# Patient Record
Sex: Male | Born: 1970 | Race: Black or African American | Hispanic: No | Marital: Married | Smoking: Former smoker
Health system: Southern US, Community
[De-identification: ages and names within clinical notes are randomized; demographics above are authoritative.]

## PROBLEM LIST (undated history)

## (undated) DIAGNOSIS — B354 Tinea corporis: Secondary | ICD-10-CM

## (undated) DIAGNOSIS — E785 Hyperlipidemia, unspecified: Secondary | ICD-10-CM

## (undated) DIAGNOSIS — A6920 Lyme disease, unspecified: Secondary | ICD-10-CM

## (undated) DIAGNOSIS — J310 Chronic rhinitis: Secondary | ICD-10-CM

## (undated) DIAGNOSIS — L309 Dermatitis, unspecified: Secondary | ICD-10-CM

## (undated) DIAGNOSIS — A6002 Herpesviral infection of other male genital organs: Secondary | ICD-10-CM

## (undated) DIAGNOSIS — F1910 Other psychoactive substance abuse, uncomplicated: Secondary | ICD-10-CM

## (undated) DIAGNOSIS — F1020 Alcohol dependence, uncomplicated: Secondary | ICD-10-CM

## (undated) DIAGNOSIS — H469 Unspecified optic neuritis: Secondary | ICD-10-CM

## (undated) HISTORY — DX: Herpesviral infection of other male genital organs: A60.02

## (undated) HISTORY — DX: Other psychoactive substance abuse, uncomplicated: F19.10

## (undated) HISTORY — PX: TONSILLECTOMY: SUR1361

## (undated) HISTORY — DX: Alcohol dependence, uncomplicated: F10.20

## (undated) HISTORY — DX: Tinea corporis: B35.4

## (undated) HISTORY — DX: Chronic rhinitis: J31.0

## (undated) HISTORY — DX: Hyperlipidemia, unspecified: E78.5

## (undated) HISTORY — DX: Dermatitis, unspecified: L30.9

## (undated) HISTORY — DX: Unspecified optic neuritis: H46.9

## (undated) HISTORY — DX: Lyme disease, unspecified: A69.20

---

## 1998-05-31 ENCOUNTER — Emergency Department (HOSPITAL_COMMUNITY): Admission: EM | Admit: 1998-05-31 | Discharge: 1998-05-31 | Payer: Self-pay | Admitting: Emergency Medicine

## 1998-12-07 ENCOUNTER — Encounter: Payer: Self-pay | Admitting: Emergency Medicine

## 1998-12-07 ENCOUNTER — Emergency Department (HOSPITAL_COMMUNITY): Admission: EM | Admit: 1998-12-07 | Discharge: 1998-12-07 | Payer: Self-pay | Admitting: Emergency Medicine

## 2002-03-08 ENCOUNTER — Encounter: Payer: Self-pay | Admitting: Internal Medicine

## 2002-03-08 ENCOUNTER — Encounter: Admission: RE | Admit: 2002-03-08 | Discharge: 2002-03-08 | Payer: Self-pay | Admitting: Internal Medicine

## 2002-11-14 ENCOUNTER — Emergency Department (HOSPITAL_COMMUNITY): Admission: EM | Admit: 2002-11-14 | Discharge: 2002-11-14 | Payer: Self-pay | Admitting: Emergency Medicine

## 2004-12-27 ENCOUNTER — Ambulatory Visit: Payer: Self-pay | Admitting: Internal Medicine

## 2004-12-28 ENCOUNTER — Ambulatory Visit: Payer: Self-pay

## 2005-01-04 ENCOUNTER — Ambulatory Visit: Payer: Self-pay

## 2005-01-14 ENCOUNTER — Ambulatory Visit: Payer: Self-pay | Admitting: *Deleted

## 2005-01-21 ENCOUNTER — Encounter: Admission: RE | Admit: 2005-01-21 | Discharge: 2005-04-21 | Payer: Self-pay | Admitting: Cardiology

## 2005-06-10 ENCOUNTER — Ambulatory Visit: Payer: Self-pay | Admitting: Internal Medicine

## 2007-02-16 ENCOUNTER — Ambulatory Visit: Payer: Self-pay | Admitting: Internal Medicine

## 2007-02-16 LAB — CONVERTED CEMR LAB
ALT: 24 units/L (ref 0–40)
AST: 22 units/L (ref 0–37)
Albumin: 3.8 g/dL (ref 3.5–5.2)
Alkaline Phosphatase: 44 units/L (ref 39–117)
BUN: 9 mg/dL (ref 6–23)
Basophils Absolute: 0 10*3/uL (ref 0.0–0.1)
Basophils Relative: 0.2 % (ref 0.0–1.0)
Bilirubin Urine: NEGATIVE
Bilirubin, Direct: 0.2 mg/dL (ref 0.0–0.3)
CO2: 28 meq/L (ref 19–32)
Calcium: 9.1 mg/dL (ref 8.4–10.5)
Chloride: 108 meq/L (ref 96–112)
Cholesterol: 178 mg/dL (ref 0–200)
Creatinine, Ser: 1 mg/dL (ref 0.4–1.5)
Eosinophils Absolute: 0.1 10*3/uL (ref 0.0–0.6)
Eosinophils Relative: 0.9 % (ref 0.0–5.0)
GFR calc Af Amer: 109 mL/min
GFR calc non Af Amer: 90 mL/min
Glucose, Bld: 103 mg/dL — ABNORMAL HIGH (ref 70–99)
HCT: 43.3 % (ref 39.0–52.0)
HDL: 40.6 mg/dL (ref >39.0)
Hemoglobin: 14.8 g/dL (ref 13.0–17.0)
Ketones, ur: NEGATIVE mg/dL
LDL Cholesterol: 122 mg/dL — ABNORMAL HIGH (ref 0–99)
Leukocytes, UA: NEGATIVE
Lymphocytes Relative: 38 % (ref 12.0–46.0)
MCHC: 34.3 g/dL (ref 30.0–36.0)
MCV: 89.6 fL (ref 78.0–100.0)
Monocytes Absolute: 0.4 10*3/uL (ref 0.2–0.7)
Monocytes Relative: 6.7 % (ref 3.0–11.0)
Neutro Abs: 3.2 10*3/uL (ref 1.4–7.7)
Neutrophils Relative %: 54.2 % (ref 43.0–77.0)
Nitrite: NEGATIVE
Platelets: 162 10*3/uL (ref 150–400)
Potassium: 4.3 meq/L (ref 3.5–5.1)
RBC: 4.83 M/uL (ref 4.22–5.81)
RDW: 13.3 % (ref 11.5–14.6)
Sodium: 142 meq/L (ref 135–145)
Specific Gravity, Urine: 1.025 (ref 1.000–1.03)
TSH: 1.59 microintl units/mL (ref 0.35–5.50)
Total Bilirubin: 0.8 mg/dL (ref 0.3–1.2)
Total CHOL/HDL Ratio: 4.4
Total Protein, Urine: NEGATIVE mg/dL
Total Protein: 6.9 g/dL (ref 6.0–8.3)
Triglycerides: 75 mg/dL (ref 0–149)
Urine Glucose: NEGATIVE mg/dL
Urobilinogen, UA: 0.2 (ref 0.0–1.0)
VLDL: 15 mg/dL (ref 0–40)
WBC: 6 10*3/uL (ref 4.5–10.5)
pH: 6 (ref 5.0–8.0)

## 2007-02-24 ENCOUNTER — Ambulatory Visit: Payer: Self-pay | Admitting: Internal Medicine

## 2007-06-22 ENCOUNTER — Ambulatory Visit: Payer: Self-pay | Admitting: Internal Medicine

## 2007-06-22 LAB — CONVERTED CEMR LAB
BUN: 12 mg/dL (ref 6–23)
CO2: 28 meq/L (ref 19–32)
Calcium: 9.1 mg/dL (ref 8.4–10.5)
Chloride: 105 meq/L (ref 96–112)
Creatinine, Ser: 1.1 mg/dL (ref 0.4–1.5)
GFR calc Af Amer: 97 mL/min
GFR calc non Af Amer: 81 mL/min
Glucose, Bld: 103 mg/dL — ABNORMAL HIGH (ref 70–99)
Potassium: 4.3 meq/L (ref 3.5–5.1)
Sodium: 139 meq/L (ref 135–145)

## 2007-06-24 ENCOUNTER — Encounter: Admission: RE | Admit: 2007-06-24 | Discharge: 2007-06-24 | Payer: Self-pay | Admitting: Internal Medicine

## 2007-07-13 ENCOUNTER — Encounter: Payer: Self-pay | Admitting: *Deleted

## 2007-08-26 DIAGNOSIS — A6920 Lyme disease, unspecified: Secondary | ICD-10-CM

## 2007-08-26 HISTORY — DX: Lyme disease, unspecified: A69.20

## 2007-08-31 ENCOUNTER — Ambulatory Visit: Payer: Self-pay | Admitting: Internal Medicine

## 2007-09-29 ENCOUNTER — Telehealth: Payer: Self-pay | Admitting: Internal Medicine

## 2007-11-11 ENCOUNTER — Ambulatory Visit: Payer: Self-pay | Admitting: Internal Medicine

## 2007-11-11 DIAGNOSIS — A6920 Lyme disease, unspecified: Secondary | ICD-10-CM | POA: Insufficient documentation

## 2007-11-11 DIAGNOSIS — M25569 Pain in unspecified knee: Secondary | ICD-10-CM | POA: Insufficient documentation

## 2008-05-16 ENCOUNTER — Encounter: Payer: Self-pay | Admitting: Internal Medicine

## 2010-01-17 ENCOUNTER — Ambulatory Visit: Payer: Self-pay | Admitting: Internal Medicine

## 2010-01-17 LAB — CONVERTED CEMR LAB
ALT: 37 units/L (ref 0–53)
AST: 34 units/L (ref 0–37)
Albumin: 4 g/dL (ref 3.5–5.2)
Alkaline Phosphatase: 61 units/L (ref 39–117)
BUN: 16 mg/dL (ref 6–23)
Basophils Absolute: 0 10*3/uL (ref 0.0–0.1)
Basophils Relative: 0.3 % (ref 0.0–3.0)
Bilirubin Urine: NEGATIVE
Bilirubin, Direct: 0.1 mg/dL (ref 0.0–0.3)
CO2: 28 meq/L (ref 19–32)
Calcium: 9.1 mg/dL (ref 8.4–10.5)
Chloride: 103 meq/L (ref 96–112)
Cholesterol: 156 mg/dL (ref 0–200)
Creatinine, Ser: 1.1 mg/dL (ref 0.4–1.5)
Eosinophils Absolute: 0 10*3/uL (ref 0.0–0.7)
Eosinophils Relative: 0.5 % (ref 0.0–5.0)
GFR calc non Af Amer: 95.88 mL/min (ref >60)
Glucose, Bld: 95 mg/dL (ref 70–99)
HCT: 44.2 % (ref 39.0–52.0)
HDL: 47.9 mg/dL (ref >39.00)
Hemoglobin, Urine: NEGATIVE
Hemoglobin: 14.7 g/dL (ref 13.0–17.0)
Ketones, ur: NEGATIVE mg/dL
LDL Cholesterol: 100 mg/dL — ABNORMAL HIGH (ref 0–99)
Leukocytes, UA: NEGATIVE
Lymphocytes Relative: 44.3 % (ref 12.0–46.0)
Lymphs Abs: 2.1 10*3/uL (ref 0.7–4.0)
MCHC: 33.2 g/dL (ref 30.0–36.0)
MCV: 90.7 fL (ref 78.0–100.0)
Monocytes Absolute: 0.4 10*3/uL (ref 0.1–1.0)
Monocytes Relative: 7.4 % (ref 3.0–12.0)
Neutro Abs: 2.3 10*3/uL (ref 1.4–7.7)
Neutrophils Relative %: 47.5 % (ref 43.0–77.0)
Nitrite: NEGATIVE
Platelets: 166 10*3/uL (ref 150.0–400.0)
Potassium: 4.4 meq/L (ref 3.5–5.1)
RBC: 4.87 M/uL (ref 4.22–5.81)
RDW: 13.5 % (ref 11.5–14.6)
Sodium: 139 meq/L (ref 135–145)
Specific Gravity, Urine: >=1.03 (ref 1.000–1.030)
TSH: 1.65 microintl units/mL (ref 0.35–5.50)
Total Bilirubin: 0.6 mg/dL (ref 0.3–1.2)
Total CHOL/HDL Ratio: 3
Total Protein, Urine: NEGATIVE mg/dL
Total Protein: 7.4 g/dL (ref 6.0–8.3)
Triglycerides: 43 mg/dL (ref 0.0–149.0)
Urine Glucose: NEGATIVE mg/dL
Urobilinogen, UA: 0.2 (ref 0.0–1.0)
VLDL: 8.6 mg/dL (ref 0.0–40.0)
WBC: 4.8 10*3/uL (ref 4.5–10.5)
pH: 5.5 (ref 5.0–8.0)

## 2010-01-22 ENCOUNTER — Ambulatory Visit: Payer: Self-pay | Admitting: Internal Medicine

## 2010-01-22 DIAGNOSIS — B354 Tinea corporis: Secondary | ICD-10-CM | POA: Insufficient documentation

## 2010-12-25 NOTE — Assessment & Plan Note (Signed)
Summary: cpx, # / cd   Vital Signs:  Patient profile:   40 year old male Height:      74 inches Weight:      209 pounds BMI:     26.93 Temp:     97.3 degrees F oral Pulse rate:   70 / minute BP sitting:   100 / 64  (left arm)  Vitals Entered By: Tora Perches (January 22, 2010 8:37 AM) CC: cpx Is Patient Diabetic? No   CC:  cpx.  History of Present Illness: The patient presents for a wellness examination  C/o gen warts off and on q 1-2 month x years - painful rash  Preventive Screening-Counseling & Management  Alcohol-Tobacco     Smoking Status: never  Current Medications (verified): 1)  Astelin 137 Mcg/spray  Soln (Azelastine Hcl) .... 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 Family History Diabetes 1st degree relative M Family History Hypertension B, F  Social History: Occupation:realtor Married Never Smoked Regular exercise-yes Remote h/o ETOH and coc  Review of Systems  The patient denies anorexia, fever, weight loss, weight gain, vision loss, decreased hearing, hoarseness, chest pain, syncope, dyspnea on exertion, peripheral edema, prolonged cough, headaches, hemoptysis, abdominal pain, melena, hematochezia, severe indigestion/heartburn, hematuria, incontinence, genital sores, muscle weakness, suspicious skin lesions, transient blindness, difficulty walking, depression, unusual weight change, abnormal bleeding, enlarged lymph nodes, angioedema, and testicular masses.         lost wt on diet  Physical Exam  General:  Well-developed,well-nourished,in no acute distress; alert,appropriate and  cooperative throughout examination Head:  Normocephalic and atraumatic without obvious abnormalities. No apparent alopecia or balding. Eyes:  No corneal or conjunctival inflammation noted. EOMI. Perrla. Funduscopic exam benign, without hemorrhages, exudates or papilledema. Vision grossly normal. Ears:  External ear exam shows no significant lesions or deformities.  Otoscopic examination reveals clear canals, tympanic membranes are intact bilaterally without bulging, retraction, inflammation or discharge. Hearing is grossly normal bilaterally. Nose:  External nasal examination shows no deformity or inflammation. Nasal mucosa are pink and moist without lesions or exudates. Mouth:  Oral mucosa and oropharynx without lesions or exudates.  Teeth in good repair. Neck:  No deformities, masses, or tenderness noted. Lungs:  Normal respiratory effort, chest expands symmetrically. Lungs are clear to auscultation, no crackles or wheezes. Heart:  Normal rate and regular rhythm. S1 and S2 normal without gallop, murmur, click, rub or other extra sounds. Abdomen:  Bowel sounds positive,abdomen soft and non-tender without masses, organomegaly or hernias noted. Genitalia:  Testes bilaterally descended without nodularity, tenderness or masses. No scrotal masses or lesions. Depidm. 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 Msk:  No deformity or scoliosis noted of thoracic or lumbar spine.   Extremities:  No clubbing, cyanosis, edema, or deformity noted with normal full range of motion of all joints.   Neurologic:  No cranial nerve deficits noted. Station and gait are normal. Plantar reflexes are down-going bilaterally. DTRs are symmetrical throughout. Sensory, motor and coordinative functions appear intact. Skin:  Intact without suspicious lesions or rashes Inguinal Nodes:  No significant adenopathy Psych:  Cognition and judgment appear intact. Alert and cooperative with normal attention  span and  concentration. No apparent delusions, illusions, hallucinations   Impression & Recommendations:  Problem # 1:  PHYSICAL EXAMINATION (ICD-V70.0) Assessment New Health and age related issues were discussed. Available screening tests and vaccinations were discussed as well. Healthy life style including good diet and execise was discussed.  The labs were reviewed with the patient.  Orders: EKG w/ Interpretation (93000)  Problem # 2:  Penile rash poss gen herpes, no warts Assessment: Unchanged Will treat w/Acyclovir Condoms  Problem # 3:  TINEA CORPORIS (ICD-110.5) Assessment: New See "Patient Instructions".   Problem # 4:  Elev glu Assessment: Improved  Complete Medication List: 1)  Astelin 137 Mcg/spray Soln (Azelastine hcl) .... Two times a day 2)  Acyclovir 400 Mg Tabs (Acyclovir) .Marland Kitchen.. 1 by mouth three times a day as needed herpes x 7 d 3)  Ketoconazole 2 % Crea (Ketoconazole) .... Use bid 4)  Vitamin D 1000 Unit Tabs (Cholecalciferol) .Marland Kitchen.. 1 by mouth qd  Other Orders: Admin 1st Vaccine (78295) Flu Vaccine 81yrs + (62130)  Patient Instructions: 1)  Try to eat more raw plant food, fresh and dry fruit, raw almonds, leafy vegetables, whole foods and less red meat, less animal fat. Poultry and fish is better for you than pork and beef. Avoid processed foods (canned soups, hot dogs, sausage, bacon , frozen dinners). Avoid corn syrup, high fructose syrup or aspartam . Make your own  dressing with olive oil, wine vinegar, lemon juce, garlic etc. for your salads. 2)  Please schedule a follow-up appointment in 1 year. Prescriptions: KETOCONAZOLE 2 % CREA (KETOCONAZOLE) use bid  #90 g x 3   Entered and Authorized by:   Tresa Garter MD   Signed by:   Tresa Garter MD on 01/22/2010   Method used:   Print then Give to Patient   RxID:   [id number] ACYCLOVIR 400 MG TABS (ACYCLOVIR) 1 by mouth three times a day as needed herpes x 7 d  #21 x 3   Entered and Authorized by:    Tresa Garter MD   Signed by:   Tresa Garter MD on 01/22/2010   Method used:   Print then Give to Patient   RxID:   [id number]    Influenza Vaccine (to be given today)     Flu Vaccine Consent Questions     Do you have a history of severe allergic reactions to this vaccine? no    Any prior history of allergic reactions to egg and/or gelatin? no    Do you have a sensitivity to the preservative Thimersol? no    Do you have a past history of Guillan-Barre Syndrome? no    Do you currently have an acute febrile illness? no    Have you ever had a severe reaction to latex? no    Vaccine information given and explained to patient? yes    Are you currently pregnant? no    Lot Number:[id number]   Exp Date:05/24/2010   Site Given  right Deltoid IMbflu

## 2011-09-19 ENCOUNTER — Telehealth: Payer: Self-pay | Admitting: *Deleted

## 2011-09-19 NOTE — Telephone Encounter (Signed)
Pt left vm asking when/where he was referred out for his swollen optic nerve. He states he is applying for life ins and they are needing this info. Called pt to advise we need to pull his paper chart because I cant find any referrals in [facility]. Call was disconnected. Will call patient after I review paper chart.

## 2011-09-20 NOTE — Telephone Encounter (Signed)
I reviewed pt's paper chart. All I can find is a referral we arranged for pt to see Dr. Vickey Huger in 06/2007 and correspondence from when pt was evaluated by Fabio Neighbors, O.D. On 02/06/2007. I left him a detailed mess informing pt of this.

## 2012-04-29 ENCOUNTER — Encounter: Payer: Self-pay | Admitting: Internal Medicine

## 2012-04-29 ENCOUNTER — Ambulatory Visit (INDEPENDENT_AMBULATORY_CARE_PROVIDER_SITE_OTHER): Payer: BC Managed Care – PPO | Admitting: Internal Medicine

## 2012-04-29 VITALS — BP 110/60 | HR 72 | Temp 97.5°F | Resp 16 | Wt 224.0 lb

## 2012-04-29 DIAGNOSIS — Z800 Family history of malignant neoplasm of digestive organs: Secondary | ICD-10-CM | POA: Insufficient documentation

## 2012-04-29 DIAGNOSIS — Z0000 Encounter for general adult medical examination without abnormal findings: Secondary | ICD-10-CM

## 2012-04-29 DIAGNOSIS — Z23 Encounter for immunization: Secondary | ICD-10-CM

## 2012-04-29 DIAGNOSIS — L0201 Cutaneous abscess of face: Secondary | ICD-10-CM

## 2012-04-29 MED ORDER — DOXYCYCLINE HYCLATE 100 MG PO TABS: 100.0000 mg | ORAL_TABLET | Freq: Two times a day (BID) | ORAL | Status: AC

## 2012-04-29 NOTE — Assessment & Plan Note (Signed)
Doxy x 10 d 

## 2012-04-29 NOTE — Assessment & Plan Note (Signed)
GI cons re -- does he need a colon?

## 2012-04-29 NOTE — Progress Notes (Signed)
  Subjective:    Patient ID: Timothy Petersen, male    DOB: [birth year], 40 y.o.   MRN: [id number]  HPI C/o a boil on  L face x 2 wks and gland swelling The patient is here for a wellness exam. The patient has been doing well overall without major physical or psychological issues going on lately.  Review of Systems  Constitutional: Negative for appetite change, fatigue and unexpected weight change.  HENT: Negative for nosebleeds, congestion, sore throat, sneezing, trouble swallowing and neck pain.   Eyes: Negative for itching and visual disturbance.  Respiratory: Negative for cough.   Cardiovascular: Negative for chest pain, palpitations and leg swelling.  Gastrointestinal: Negative for nausea, vomiting, abdominal pain, diarrhea, constipation, blood in stool and abdominal distention.  Genitourinary: Negative for frequency and hematuria.  Musculoskeletal: Negative for back pain, joint swelling and gait problem.  Skin: Negative for rash and wound.  Neurological: Negative for dizziness, tremors, speech difficulty and weakness.  Psychiatric/Behavioral: Negative for suicidal ideas, sleep disturbance, dysphoric mood and agitation. The patient is not nervous/anxious.        Objective:   Physical Exam  Constitutional: He is oriented to person, place, and time. He appears well-developed.  HENT:  Mouth/Throat: Oropharynx is clear and moist.  Eyes: Conjunctivae are normal. Pupils are equal, round, and reactive to light.  Neck: Normal range of motion. No JVD present. No thyromegaly present.  Cardiovascular: Normal rate, regular rhythm, normal heart sounds and intact distal pulses.  Exam reveals no gallop and no friction rub.   No murmur heard. Pulmonary/Chest: Effort normal and breath sounds normal. No respiratory distress. He has no wheezes. He has no rales. He exhibits no tenderness.  Abdominal: Soft. Bowel sounds are normal. He exhibits no distension and no mass. There is no tenderness. There is  no rebound and no guarding.  Musculoskeletal: Normal range of motion. He exhibits no edema and no tenderness.  Lymphadenopathy:    He has no cervical adenopathy.  Neurological: He is alert and oriented to person, place, and time. He has normal reflexes. No cranial nerve deficit. He exhibits normal muscle tone. Coordination normal.  Skin: Skin is warm and dry. No rash noted.       Boil<1 cm L cheek  Psychiatric: He has a normal mood and affect. His behavior is normal. Judgment and thought content normal.  Prostate/rectal - per GI        Assessment & Plan:

## 2012-05-19 ENCOUNTER — Encounter: Payer: Self-pay | Admitting: Internal Medicine

## 2012-05-22 ENCOUNTER — Other Ambulatory Visit (INDEPENDENT_AMBULATORY_CARE_PROVIDER_SITE_OTHER): Payer: BC Managed Care – PPO

## 2012-05-22 DIAGNOSIS — Z800 Family history of malignant neoplasm of digestive organs: Secondary | ICD-10-CM

## 2012-05-22 DIAGNOSIS — L0201 Cutaneous abscess of face: Secondary | ICD-10-CM

## 2012-05-22 DIAGNOSIS — Z0000 Encounter for general adult medical examination without abnormal findings: Secondary | ICD-10-CM

## 2012-05-22 DIAGNOSIS — L03211 Cellulitis of face: Secondary | ICD-10-CM

## 2012-05-22 LAB — CBC WITH DIFFERENTIAL/PLATELET
Basophils Relative: 0.6 % (ref 0.0–3.0)
Eosinophils Absolute: 0.1 10*3/uL (ref 0.0–0.7)
Eosinophils Relative: 1 % (ref 0.0–5.0)
Lymphocytes Relative: 44.9 % (ref 12.0–46.0)
MCV: 90.5 fl (ref 78.0–100.0)
Monocytes Absolute: 0.5 10*3/uL (ref 0.1–1.0)
Neutrophils Relative %: 45 % (ref 43.0–77.0)
Platelets: 154 10*3/uL (ref 150.0–400.0)
RBC: 4.85 Mil/uL (ref 4.22–5.81)
WBC: 5.6 10*3/uL (ref 4.5–10.5)

## 2012-05-22 LAB — BASIC METABOLIC PANEL
BUN: 17 mg/dL (ref 6–23)
Calcium: 9.7 mg/dL (ref 8.4–10.5)
Creatinine, Ser: 1.2 mg/dL (ref 0.4–1.5)
GFR: 84.08 mL/min (ref >60.00)

## 2012-05-22 LAB — PSA: PSA: 0.29 ng/mL (ref 0.10–4.00)

## 2012-05-22 LAB — URINALYSIS
Bilirubin Urine: NEGATIVE
Nitrite: NEGATIVE
Total Protein, Urine: NEGATIVE
pH: 6.5 (ref 5.0–8.0)

## 2012-05-22 LAB — LIPID PANEL
Cholesterol: 193 mg/dL (ref 0–200)
HDL: 50.7 mg/dL (ref >39.00)
LDL Cholesterol: 134 mg/dL — ABNORMAL HIGH (ref 0–99)
Total CHOL/HDL Ratio: 4
Triglycerides: 44 mg/dL (ref 0.0–149.0)

## 2012-05-22 LAB — HEPATIC FUNCTION PANEL
AST: 75 U/L — ABNORMAL HIGH (ref 0–37)
Albumin: 4.1 g/dL (ref 3.5–5.2)
Alkaline Phosphatase: 45 U/L (ref 39–117)
Total Protein: 7.2 g/dL (ref 6.0–8.3)

## 2012-05-22 LAB — TSH: TSH: 2.52 u[IU]/mL (ref 0.35–5.50)

## 2012-06-04 ENCOUNTER — Encounter: Payer: Self-pay | Admitting: Internal Medicine

## 2012-06-04 ENCOUNTER — Ambulatory Visit (INDEPENDENT_AMBULATORY_CARE_PROVIDER_SITE_OTHER): Payer: BC Managed Care – PPO | Admitting: Internal Medicine

## 2012-06-04 ENCOUNTER — Telehealth: Payer: Self-pay

## 2012-06-04 VITALS — BP 100/78 | HR 60 | Ht 74.0 in | Wt 224.2 lb

## 2012-06-04 DIAGNOSIS — Z800 Family history of malignant neoplasm of digestive organs: Secondary | ICD-10-CM

## 2012-06-04 DIAGNOSIS — Z1211 Encounter for screening for malignant neoplasm of colon: Secondary | ICD-10-CM

## 2012-06-04 MED ORDER — MOVIPREP 100 G PO SOLR: ORAL | Status: DC

## 2012-06-04 NOTE — Patient Instructions (Addendum)
You have been scheduled for a colonoscopy with propofol. Please follow written instructions given to you at your visit today.  Please pick up your prep kit at the pharmacy within the next 1-3 days. If you use inhalers (even only as needed), please bring them with you on the day of your procedure.   Thank you for choosing [facility] today.

## 2012-06-04 NOTE — Telephone Encounter (Signed)
Pt called requesting MD's advisement regarding elevation of AST enzymes seen on most recent labs, please advise.

## 2012-06-04 NOTE — Telephone Encounter (Signed)
Not sure - it was mild. Recheck LFT, HepBsAb, HepBsAg, Hep C serol -- fasting in 2 wks Thx

## 2012-06-04 NOTE — Progress Notes (Signed)
  Subjective:    Patient ID: Timothy Petersen, male    DOB: [birth year], 41 y.o.   MRN: [id number]  HPI This is a middle-here to discuss colonoscopy. His sister was diagnosed with rectal cancer at the age of 36 [place] one year ago. She is status post resection and radiation and chemotherapy and is doing well currently. He has no GI symptoms. There is no other family history of GI cancer or genitourinary cancers.aged [place] man  Medications, allergies, past medical history, past surgical history, family history and social history are reviewed and updated in the EMR.  Review of Systems  positive for palpitations, allergies, recent facial abscess treated successfully. Outpatient oral antibiotics.     Objective:   Physical Exam General:  NAD Eyes:   anicteric Lungs:  clear Heart:  S1S2 no rubs, murmurs or gallops Abdomen:  soft and nontender, BS+ Ext:   no edema      Assessment & Plan:   1. Family history of colon cancer  in sister, age 63 (rectal cancer)   2. Special screening for malignancy of colon and rectum   He is at  increased risk of colorectal cancer given his family history with one first-degree relative diagnosed before the age of 41. Colonoscopy will be undertaken.The risks and benefits as well as alternatives of endoscopic procedure(s) have been discussed and reviewed. All questions answered. The patient agrees to proceed.

## 2012-06-05 NOTE — Telephone Encounter (Signed)
Left mess for patient to call back.  

## 2012-07-02 ENCOUNTER — Ambulatory Visit (AMBULATORY_SURGERY_CENTER): Payer: BC Managed Care – PPO | Admitting: Internal Medicine

## 2012-07-02 ENCOUNTER — Encounter: Payer: Self-pay | Admitting: Internal Medicine

## 2012-07-02 VITALS — BP 144/92 | HR 91 | Temp 97.9°F | Resp 16 | Ht 74.0 in | Wt 224.0 lb

## 2012-07-02 DIAGNOSIS — Z800 Family history of malignant neoplasm of digestive organs: Secondary | ICD-10-CM

## 2012-07-02 DIAGNOSIS — Z1211 Encounter for screening for malignant neoplasm of colon: Secondary | ICD-10-CM

## 2012-07-02 MED ORDER — SODIUM CHLORIDE 0.9 % IV SOLN: 500.0000 mL | INTRAVENOUS | Status: DC

## 2012-07-02 NOTE — Op Note (Signed)
[facility] [address] [place], [place]  [address]  COLONOSCOPY PROCEDURE REPORT  PATIENT:  Timothy, Petersen  MR#:  [id number] BIRTHDATE:  Apr 23, 1971, 41 yrs. old  GENDER:  male ENDOSCOPIST:  Iva Boop, MD, [facility] REF. BY:  Linda Hedges. Plotnikov, M.D. PROCEDURE DATE:  07/02/2012 PROCEDURE:  Colonoscopy 62130 ASA CLASS:  Class II INDICATIONS:  Elevated Risk Screening, family history of colon cancer sister w/ rectal cancer at 6 MEDICATIONS:   These medications were titrated to patient response per physician's verbal order, MAC sedation, administered by CRNA, propofol (Diprivan) 450 mg IV  DESCRIPTION OF PROCEDURE:   After the risks benefits and alternatives of the procedure were thoroughly explained, informed consent was obtained.  Digital rectal exam was performed and revealed no abnormalities and normal prostate.   The LB [id number] endoscope was introduced through the anus and advanced to the cecum, which was identified by both the appendix and ileocecal valve, without limitations.  The quality of the prep was excellent, using MoviPrep.  The instrument was then slowly withdrawn as the colon was fully examined. <<PROCEDUREIMAGES>>  FINDINGS:  A normal appearing cecum, ileocecal valve, and appendiceal orifice were identified. The ascending, hepatic flexure, transverse, splenic flexure, descending, sigmoid colon, and rectum appeared unremarkable.   Retroflexed views in the right colon and rectum revealed no abnormalities.    The time to cecum = 4:36 minutes. The scope was then withdrawn in 9:05 minutes from the cecum and the procedure completed. COMPLICATIONS:  None ENDOSCOPIC IMPRESSION: 1) Normal colonoscopy w/ excellent prep 2) Family history of rectal cancer  REPEAT EXAM:  In 5 year(s) for routine screening colonoscopy.  Iva Boop, MD, [facility]  CC:  Linda Hedges. Plotnikov, MD and The Patient  n. eSIGNED:   Iva Boop at 07/02/2012 02:34  PM  Marcina Millard, [phone]

## 2012-07-02 NOTE — Progress Notes (Signed)
Patient did not experience any of the following events: a burn prior to discharge; a fall within the facility; wrong site/side/patient/procedure/implant event; or a hospital transfer or hospital admission upon discharge from the facility. (G8907) Patient did not have preoperative order for IV antibiotic SSI prophylaxis. (G8918)  

## 2012-07-02 NOTE — Patient Instructions (Addendum)
The colonoscopy was normal and you had an excellent prep.  You should repeat a colonoscopy in 2018 (5 years).  Thank you for choosing me and [facility].  Iva Boop, MD, FACG  YOU HAD AN ENDOSCOPIC PROCEDURE TODAY AT THE [facility]: Refer to the procedure report that was given to you for any specific questions about what was found during the examination.  If the procedure report does not answer your questions, please call your gastroenterologist to clarify.  If you requested that your care partner not be given the details of your procedure findings, then the procedure report has been included in a sealed envelope for you to review at your convenience later.  YOU SHOULD EXPECT: Some feelings of bloating in the abdomen. Passage of more gas than usual.  Walking can help get rid of the air that was put into your GI tract during the procedure and reduce the bloating. If you had a lower endoscopy (such as a colonoscopy or flexible sigmoidoscopy) you may notice spotting of blood in your stool or on the toilet paper. If you underwent a bowel prep for your procedure, then you may not have a normal bowel movement for a few days.  DIET: Your first meal following the procedure should be a light meal and then it is ok to progress to your normal diet.  A half-sandwich or bowl of soup is an example of a good first meal.  Heavy or fried foods are harder to digest and may make you feel nauseous or bloated.  Likewise meals heavy in dairy and vegetables can cause extra gas to form and this can also increase the bloating.  Drink plenty of fluids but you should avoid alcoholic beverages for 24 hours.  ACTIVITY: Your care partner should take you home directly after the procedure.  You should plan to take it easy, moving slowly for the rest of the day.  You can resume normal activity the day after the procedure however you should NOT DRIVE or use heavy machinery for 24 hours (because of the  sedation medicines used during the test).    SYMPTOMS TO REPORT IMMEDIATELY: A gastroenterologist can be reached at any hour.  During normal business hours, 8:30 AM to 5:00 PM Monday through Friday, call [phone].  After hours and on weekends, please call the GI answering service at [phone] who will take a message and have the physician on call contact you.   Following lower endoscopy (colonoscopy or flexible sigmoidoscopy):  Excessive amounts of blood in the stool  Significant tenderness or worsening of abdominal pains  Swelling of the abdomen that is new, acute  Fever of 100F or higher  Following upper endoscopy (EGD)  Vomiting of blood or coffee ground material  New chest pain or pain under the shoulder blades  Painful or persistently difficult swallowing  New shortness of breath  Fever of 100F or higher  Black, tarry-looking stools  FOLLOW UP: If any biopsies were taken you will be contacted by phone or by letter within the next 1-3 weeks.  Call your gastroenterologist if you have not heard about the biopsies in 3 weeks.  Our staff will call the home number listed on your records the next business day following your procedure to check on you and address any questions or concerns that you may have at that time regarding the information given to you following your procedure. This is a courtesy call and so if there is no answer at  the home number and we have not heard from you through the emergency physician on call, we will assume that you have returned to your regular daily activities without incident.  SIGNATURES/CONFIDENTIALITY: You and/or your care partner have signed paperwork which will be entered into your electronic medical record.  These signatures attest to the fact that that the information above on your After Visit Summary has been reviewed and is understood.  Full responsibility of the confidentiality of this discharge information lies with you and/or your  care-partner.

## 2012-07-03 ENCOUNTER — Telehealth: Payer: Self-pay | Admitting: *Deleted

## 2012-07-03 NOTE — Telephone Encounter (Signed)
  Follow up Call-  Call back number 07/02/2012  Post procedure Call Back phone  # [phone]  Permission to leave phone message Yes     [facility]

## 2013-04-28 ENCOUNTER — Other Ambulatory Visit: Payer: Self-pay | Admitting: *Deleted

## 2013-04-28 ENCOUNTER — Other Ambulatory Visit (INDEPENDENT_AMBULATORY_CARE_PROVIDER_SITE_OTHER): Payer: BC Managed Care – PPO

## 2013-04-28 DIAGNOSIS — Z0000 Encounter for general adult medical examination without abnormal findings: Secondary | ICD-10-CM

## 2013-04-28 DIAGNOSIS — Z125 Encounter for screening for malignant neoplasm of prostate: Secondary | ICD-10-CM

## 2013-04-28 LAB — CBC WITH DIFFERENTIAL/PLATELET
Basophils Relative: 0.6 % (ref 0.0–3.0)
Eosinophils Relative: 5.6 % — ABNORMAL HIGH (ref 0.0–5.0)
HCT: 45.9 % (ref 39.0–52.0)
Lymphs Abs: 2.2 10*3/uL (ref 0.7–4.0)
MCV: 90.3 fl (ref 78.0–100.0)
Monocytes Relative: 6 % (ref 3.0–12.0)
Neutrophils Relative %: 52.4 % (ref 43.0–77.0)
Platelets: 153 10*3/uL (ref 150.0–400.0)
RBC: 5.08 Mil/uL (ref 4.22–5.81)
WBC: 6.3 10*3/uL (ref 4.5–10.5)

## 2013-04-28 LAB — TSH: TSH: 2.04 u[IU]/mL (ref 0.35–5.50)

## 2013-04-28 LAB — BASIC METABOLIC PANEL
BUN: 15 mg/dL (ref 6–23)
Creatinine, Ser: 1 mg/dL (ref 0.4–1.5)
GFR: 101.75 mL/min (ref >60.00)

## 2013-04-28 LAB — URINALYSIS, ROUTINE W REFLEX MICROSCOPIC
Hgb urine dipstick: NEGATIVE
Nitrite: NEGATIVE
RBC / HPF: NONE SEEN (ref 0–?)
Specific Gravity, Urine: >=1.03 (ref 1.000–1.030)
Total Protein, Urine: NEGATIVE
WBC, UA: NONE SEEN (ref 0–?)
pH: 6 (ref 5.0–8.0)

## 2013-04-28 LAB — LIPID PANEL
Cholesterol: 177 mg/dL (ref 0–200)
LDL Cholesterol: 120 mg/dL — ABNORMAL HIGH (ref 0–99)
Total CHOL/HDL Ratio: 4
Triglycerides: 54 mg/dL (ref 0.0–149.0)
VLDL: 10.8 mg/dL (ref 0.0–40.0)

## 2013-04-28 LAB — HEPATIC FUNCTION PANEL
Bilirubin, Direct: 0.1 mg/dL (ref 0.0–0.3)
Total Bilirubin: 0.8 mg/dL (ref 0.3–1.2)

## 2013-04-28 LAB — PSA: PSA: 0.35 ng/mL (ref 0.10–4.00)

## 2013-04-30 ENCOUNTER — Ambulatory Visit (INDEPENDENT_AMBULATORY_CARE_PROVIDER_SITE_OTHER): Payer: BC Managed Care – PPO | Admitting: Internal Medicine

## 2013-04-30 ENCOUNTER — Other Ambulatory Visit (INDEPENDENT_AMBULATORY_CARE_PROVIDER_SITE_OTHER): Payer: BC Managed Care – PPO

## 2013-04-30 ENCOUNTER — Encounter: Payer: Self-pay | Admitting: Internal Medicine

## 2013-04-30 VITALS — BP 114/76 | HR 72 | Temp 98.3°F | Resp 16 | Wt 221.0 lb

## 2013-04-30 DIAGNOSIS — R1013 Epigastric pain: Secondary | ICD-10-CM | POA: Insufficient documentation

## 2013-04-30 LAB — H. PYLORI ANTIBODY, IGG: H Pylori IgG: NEGATIVE

## 2013-04-30 MED ORDER — PANTOPRAZOLE SODIUM 40 MG PO TBEC: 40.0000 mg | DELAYED_RELEASE_TABLET | Freq: Every day | ORAL | Status: DC

## 2013-04-30 NOTE — Assessment & Plan Note (Addendum)
Omeprazole samples 20 mg/d Labs H pilori Do not use MVI/supplements

## 2013-04-30 NOTE — Progress Notes (Signed)
  Subjective:    Patient ID: Timothy Petersen, male    DOB: [birth year], 42 y.o.   MRN: [id number]  Abdominal Pain This is a new problem. The current episode started in the past 7 days. The onset quality is gradual. The problem occurs 2 to 4 times per day. The problem has been waxing and waning. The pain is located in the epigastric region. The pain is at a severity of 4/10. The pain is moderate. The quality of the pain is sharp. The abdominal pain radiates to the epigastric region. Associated symptoms include nausea. He has tried antacids for the symptoms. The treatment provided mild relief. His past medical history is significant for PUD.      Review of Systems  Gastrointestinal: Positive for nausea and abdominal pain.       Objective:   Physical Exam  Constitutional: He is oriented to person, place, and time. He appears well-developed. No distress.  NAD  HENT:  Mouth/Throat: Oropharynx is clear and moist.  Eyes: Conjunctivae are normal. Pupils are equal, round, and reactive to light.  Neck: Normal range of motion. No JVD present. No thyromegaly present.  Cardiovascular: Normal rate, regular rhythm, normal heart sounds and intact distal pulses.  Exam reveals no gallop and no friction rub.   No murmur heard. Pulmonary/Chest: Effort normal and breath sounds normal. No respiratory distress. He has no wheezes. He has no rales. He exhibits no tenderness.  Abdominal: Soft. Bowel sounds are normal. He exhibits no distension and no mass. There is tenderness (sensitive in epigastric area). There is no rebound and no guarding.  Musculoskeletal: Normal range of motion. He exhibits no edema and no tenderness.  Lymphadenopathy:    He has no cervical adenopathy.  Neurological: He is alert and oriented to person, place, and time. He has normal reflexes. No cranial nerve deficit. He exhibits normal muscle tone. He displays a negative Romberg sign. Coordination and gait normal.  No meningeal signs   Skin: Skin is warm and dry. No rash noted.  Psychiatric: He has a normal mood and affect. His behavior is normal. Judgment and thought content normal.          Assessment & Plan:

## 2013-05-04 ENCOUNTER — Ambulatory Visit (INDEPENDENT_AMBULATORY_CARE_PROVIDER_SITE_OTHER): Payer: BC Managed Care – PPO | Admitting: Internal Medicine

## 2013-05-04 ENCOUNTER — Encounter: Payer: Self-pay | Admitting: Internal Medicine

## 2013-05-04 VITALS — BP 108/62 | HR 68 | Temp 98.1°F | Resp 16 | Ht 74.0 in | Wt 221.0 lb

## 2013-05-04 DIAGNOSIS — Z0000 Encounter for general adult medical examination without abnormal findings: Secondary | ICD-10-CM

## 2013-05-04 DIAGNOSIS — R1013 Epigastric pain: Secondary | ICD-10-CM

## 2013-05-04 NOTE — Progress Notes (Deleted)
Patient ID: Timothy Petersen, male   DOB: 08-Nov-1971, 42 y.o.   MRN: [id number]

## 2013-05-08 DIAGNOSIS — Z0000 Encounter for general adult medical examination without abnormal findings: Secondary | ICD-10-CM | POA: Insufficient documentation

## 2013-05-08 NOTE — Assessment & Plan Note (Signed)
6/14 gastritis - better Finish PPI after 6 weeks

## 2013-05-08 NOTE — Progress Notes (Signed)
  Subjective:     HPI  The patient is here for a wellness exam. The epig pain is better  BP Readings from Last 3 Encounters:  05/04/13 108/62  04/30/13 114/76  07/02/12 144/92   Wt Readings from Last 3 Encounters:  05/04/13 221 lb (100.245 kg)  04/30/13 221 lb (100.245 kg)  07/02/12 224 lb (101.606 kg)      .   Review of Systems  Constitutional: Negative for appetite change, fatigue and unexpected weight change.  HENT: Negative for nosebleeds, congestion, sore throat, sneezing, trouble swallowing and neck pain.   Eyes: Negative for itching and visual disturbance.  Respiratory: Negative for cough.   Cardiovascular: Negative for chest pain, palpitations and leg swelling.  Gastrointestinal: Negative for nausea, diarrhea, blood in stool and abdominal distention.  Genitourinary: Negative for frequency and hematuria.  Musculoskeletal: Negative for back pain, joint swelling and gait problem.  Skin: Negative for rash.  Neurological: Negative for dizziness, tremors, speech difficulty and weakness.  Psychiatric/Behavioral: Negative for sleep disturbance, dysphoric mood and agitation. The patient is not nervous/anxious.        Objective:   Physical Exam  Constitutional: He is oriented to person, place, and time. He appears well-developed. No distress.  NAD  HENT:  Mouth/Throat: Oropharynx is clear and moist.  Eyes: Conjunctivae are normal. Pupils are equal, round, and reactive to light.  Neck: Normal range of motion. No JVD present. No thyromegaly present.  Cardiovascular: Normal rate, regular rhythm, normal heart sounds and intact distal pulses.  Exam reveals no gallop and no friction rub.   No murmur heard. Pulmonary/Chest: Effort normal and breath sounds normal. No respiratory distress. He has no wheezes. He has no rales. He exhibits no tenderness.  Abdominal: Soft. Bowel sounds are normal. He exhibits no distension and no mass. There is no tenderness. There is no rebound and  no guarding.  Musculoskeletal: Normal range of motion. He exhibits no edema and no tenderness.  Lymphadenopathy:    He has no cervical adenopathy.  Neurological: He is alert and oriented to person, place, and time. He has normal reflexes. No cranial nerve deficit. He exhibits normal muscle tone. He displays a negative Romberg sign. Coordination and gait normal.  No meningeal signs  Skin: Skin is warm and dry. No rash noted.  Psychiatric: He has a normal mood and affect. His behavior is normal. Judgment and thought content normal.    Lab Results  Component Value Date   WBC 6.3 04/28/2013   HGB 15.4 04/28/2013   HCT 45.9 04/28/2013   PLT 153.0 04/28/2013   GLUCOSE 91 04/28/2013   CHOL 177 04/28/2013   TRIG 54.0 04/28/2013   HDL 45.80 04/28/2013   LDLCALC 120* 04/28/2013   ALT 26 04/28/2013   AST 23 04/28/2013   NA 139 04/28/2013   K 4.5 04/28/2013   CL 106 04/28/2013   CREATININE 1.0 04/28/2013   BUN 15 04/28/2013   CO2 25 04/28/2013   TSH 2.04 04/28/2013   PSA 0.35 04/28/2013         Assessment & Plan:

## 2013-05-08 NOTE — Assessment & Plan Note (Signed)
We discussed age appropriate health related issues, including available/recomended screening tests and vaccinations. We discussed a need for adhering to healthy diet and exercise. Labs/EKG were reviewed/ordered. All questions were answered.   

## 2013-09-30 ENCOUNTER — Other Ambulatory Visit: Payer: Self-pay

## 2014-05-19 ENCOUNTER — Encounter: Payer: Self-pay | Admitting: Internal Medicine

## 2014-05-19 ENCOUNTER — Ambulatory Visit (INDEPENDENT_AMBULATORY_CARE_PROVIDER_SITE_OTHER): Payer: No Typology Code available for payment source | Admitting: Internal Medicine

## 2014-05-19 ENCOUNTER — Other Ambulatory Visit (INDEPENDENT_AMBULATORY_CARE_PROVIDER_SITE_OTHER): Payer: No Typology Code available for payment source

## 2014-05-19 VITALS — BP 120/80 | HR 76 | Temp 98.4°F | Resp 16 | Ht 74.0 in | Wt 209.0 lb

## 2014-05-19 DIAGNOSIS — G43909 Migraine, unspecified, not intractable, without status migrainosus: Secondary | ICD-10-CM | POA: Insufficient documentation

## 2014-05-19 DIAGNOSIS — Z0000 Encounter for general adult medical examination without abnormal findings: Secondary | ICD-10-CM

## 2014-05-19 DIAGNOSIS — R51 Headache: Secondary | ICD-10-CM

## 2014-05-19 LAB — CBC WITH DIFFERENTIAL/PLATELET
BASOS PCT: 0.4 % (ref 0.0–3.0)
Basophils Absolute: 0 10*3/uL (ref 0.0–0.1)
EOS PCT: 0.5 % (ref 0.0–5.0)
Eosinophils Absolute: 0 10*3/uL (ref 0.0–0.7)
HCT: 46.1 % (ref 39.0–52.0)
Hemoglobin: 15.7 g/dL (ref 13.0–17.0)
LYMPHS ABS: 2 10*3/uL (ref 0.7–4.0)
Lymphocytes Relative: 27.8 % (ref 12.0–46.0)
MCHC: 34 g/dL (ref 30.0–36.0)
MCV: 91.5 fl (ref 78.0–100.0)
MONOS PCT: 6.9 % (ref 3.0–12.0)
Monocytes Absolute: 0.5 10*3/uL (ref 0.1–1.0)
Neutro Abs: 4.7 10*3/uL (ref 1.4–7.7)
Neutrophils Relative %: 64.4 % (ref 43.0–77.0)
PLATELETS: 185 10*3/uL (ref 150.0–400.0)
RBC: 5.04 Mil/uL (ref 4.22–5.81)
RDW: 14.7 % (ref 11.5–15.5)
WBC: 7.3 10*3/uL (ref 4.0–10.5)

## 2014-05-19 LAB — BASIC METABOLIC PANEL
BUN: 11 mg/dL (ref 6–23)
CALCIUM: 9.8 mg/dL (ref 8.4–10.5)
CO2: 32 meq/L (ref 19–32)
Chloride: 103 mEq/L (ref 96–112)
Creatinine, Ser: 1.1 mg/dL (ref 0.4–1.5)
GFR: 95.85 mL/min (ref >60.00)
GLUCOSE: 98 mg/dL (ref 70–99)
Potassium: 4.3 mEq/L (ref 3.5–5.1)
SODIUM: 136 meq/L (ref 135–145)

## 2014-05-19 LAB — HEPATIC FUNCTION PANEL
ALBUMIN: 4.5 g/dL (ref 3.5–5.2)
ALT: 24 U/L (ref 0–53)
AST: 31 U/L (ref 0–37)
Alkaline Phosphatase: 50 U/L (ref 39–117)
Bilirubin, Direct: 0.2 mg/dL (ref 0.0–0.3)
TOTAL PROTEIN: 7.3 g/dL (ref 6.0–8.3)
Total Bilirubin: 1.1 mg/dL (ref 0.2–1.2)

## 2014-05-19 LAB — LIPID PANEL
Cholesterol: 210 mg/dL — ABNORMAL HIGH (ref 0–200)
HDL: 66.8 mg/dL (ref >39.00)
LDL CALC: 135 mg/dL — AB (ref 0–99)
NonHDL: 143.2
TRIGLYCERIDES: 40 mg/dL (ref 0.0–149.0)
Total CHOL/HDL Ratio: 3
VLDL: 8 mg/dL (ref 0.0–40.0)

## 2014-05-19 LAB — URINALYSIS
Bilirubin Urine: NEGATIVE
Hgb urine dipstick: NEGATIVE
Ketones, ur: NEGATIVE
Leukocytes, UA: NEGATIVE
Nitrite: NEGATIVE
PH: 6.5 (ref 5.0–8.0)
SPECIFIC GRAVITY, URINE: 1.02 (ref 1.000–1.030)
Total Protein, Urine: NEGATIVE
URINE GLUCOSE: NEGATIVE
Urobilinogen, UA: 1 (ref 0.0–1.0)

## 2014-05-19 LAB — TSH: TSH: 1.78 u[IU]/mL (ref 0.35–4.50)

## 2014-05-19 LAB — HIV ANTIBODY (ROUTINE TESTING W REFLEX): HIV 1&2 Ab, 4th Generation: NONREACTIVE

## 2014-05-19 LAB — PSA: PSA: 0.54 ng/mL (ref 0.10–4.00)

## 2014-05-19 LAB — TESTOSTERONE: Testosterone: 230.87 ng/dL — ABNORMAL LOW (ref 300.00–890.00)

## 2014-05-19 NOTE — Assessment & Plan Note (Signed)
We discussed age appropriate health related issues, including available/recomended screening tests and vaccinations. We discussed a need for adhering to healthy diet and exercise. Labs/EKG were reviewed/ordered. All questions were answered.   

## 2014-05-19 NOTE — Progress Notes (Signed)
Pre visit review using our clinic review tool, if applicable. No additional management support is needed unless otherwise documented below in the visit note. 

## 2014-05-19 NOTE — Assessment & Plan Note (Signed)
Prn Aleve Call if worse Labs

## 2014-05-19 NOTE — Progress Notes (Signed)
   Subjective:     HPI  The patient is here for a wellness exam. The patient has been doing well overall without major physical or psychological issues going on lately. C/o migraines triggered by alcohol or eating after eating after a work out x2 wks - resolved 2 wks ago  BP Readings from Last 3 Encounters:  05/19/14 120/80  05/04/13 108/62  04/30/13 114/76   Wt Readings from Last 3 Encounters:  05/19/14 209 lb (94.802 kg)  05/04/13 221 lb (100.245 kg)  04/30/13 221 lb (100.245 kg)      .   Review of Systems  Constitutional: Negative for appetite change, fatigue and unexpected weight change.  HENT: Negative for congestion, nosebleeds, sneezing, sore throat and trouble swallowing.   Eyes: Negative for itching and visual disturbance.  Respiratory: Negative for cough.   Cardiovascular: Negative for chest pain, palpitations and leg swelling.  Gastrointestinal: Negative for nausea, diarrhea, blood in stool and abdominal distention.  Genitourinary: Negative for frequency and hematuria.  Musculoskeletal: Negative for back pain, gait problem, joint swelling and neck pain.  Skin: Negative for rash.  Neurological: Negative for dizziness, tremors, speech difficulty and weakness.  Psychiatric/Behavioral: Negative for sleep disturbance, dysphoric mood and agitation. The patient is not nervous/anxious.        Objective:   Physical Exam  Constitutional: He is oriented to person, place, and time. He appears well-developed. No distress.  NAD  HENT:  Mouth/Throat: Oropharynx is clear and moist.  Eyes: Conjunctivae are normal. Pupils are equal, round, and reactive to light.  Neck: Normal range of motion. No JVD present. No thyromegaly present.  Cardiovascular: Normal rate, regular rhythm, normal heart sounds and intact distal pulses.  Exam reveals no gallop and no friction rub.   No murmur heard. Pulmonary/Chest: Effort normal and breath sounds normal. No respiratory distress. He has no  wheezes. He has no rales. He exhibits no tenderness.  Abdominal: Soft. Bowel sounds are normal. He exhibits no distension and no mass. There is no tenderness. There is no rebound and no guarding.  Musculoskeletal: Normal range of motion. He exhibits no edema and no tenderness.  Lymphadenopathy:    He has no cervical adenopathy.  Neurological: He is alert and oriented to person, place, and time. He has normal reflexes. No cranial nerve deficit. He exhibits normal muscle tone. He displays a negative Romberg sign. Coordination and gait normal.  No meningeal signs  Skin: Skin is warm and dry. No rash noted.  Psychiatric: He has a normal mood and affect. His behavior is normal. Judgment and thought content normal.    Lab Results  Component Value Date   WBC 6.3 04/28/2013   HGB 15.4 04/28/2013   HCT 45.9 04/28/2013   PLT 153.0 04/28/2013   GLUCOSE 91 04/28/2013   CHOL 177 04/28/2013   TRIG 54.0 04/28/2013   HDL 45.80 04/28/2013   LDLCALC 120* 04/28/2013   ALT 26 04/28/2013   AST 23 04/28/2013   NA 139 04/28/2013   K 4.5 04/28/2013   CL 106 04/28/2013   CREATININE 1.0 04/28/2013   BUN 15 04/28/2013   CO2 25 04/28/2013   TSH 2.04 04/28/2013   PSA 0.35 04/28/2013         Assessment & Plan:

## 2014-05-22 ENCOUNTER — Other Ambulatory Visit: Payer: Self-pay | Admitting: Internal Medicine

## 2014-05-22 ENCOUNTER — Encounter: Payer: Self-pay | Admitting: Internal Medicine

## 2014-05-22 DIAGNOSIS — E349 Endocrine disorder, unspecified: Secondary | ICD-10-CM

## 2014-05-23 ENCOUNTER — Encounter: Payer: Self-pay | Admitting: Internal Medicine

## 2014-08-22 ENCOUNTER — Telehealth: Payer: Self-pay | Admitting: *Deleted

## 2014-08-22 NOTE — Telephone Encounter (Signed)
A user error has taken place: open by mistake.../lmb  

## 2014-09-01 ENCOUNTER — Other Ambulatory Visit: Payer: Self-pay | Admitting: Internal Medicine

## 2014-09-01 DIAGNOSIS — E785 Hyperlipidemia, unspecified: Secondary | ICD-10-CM

## 2014-09-05 ENCOUNTER — Other Ambulatory Visit (INDEPENDENT_AMBULATORY_CARE_PROVIDER_SITE_OTHER): Payer: No Typology Code available for payment source

## 2014-09-05 DIAGNOSIS — E291 Testicular hypofunction: Secondary | ICD-10-CM

## 2014-09-05 DIAGNOSIS — E349 Endocrine disorder, unspecified: Secondary | ICD-10-CM

## 2014-09-05 DIAGNOSIS — E785 Hyperlipidemia, unspecified: Secondary | ICD-10-CM

## 2014-09-05 LAB — LIPID PANEL
Cholesterol: 171 mg/dL (ref 0–200)
HDL: 55.7 mg/dL (ref >39.00)
LDL CALC: 108 mg/dL — AB (ref 0–99)
NONHDL: 115.3
Total CHOL/HDL Ratio: 3
Triglycerides: 37 mg/dL (ref 0.0–149.0)
VLDL: 7.4 mg/dL (ref 0.0–40.0)

## 2014-09-05 LAB — BASIC METABOLIC PANEL
BUN: 11 mg/dL (ref 6–23)
CHLORIDE: 102 meq/L (ref 96–112)
CO2: 26 mEq/L (ref 19–32)
CREATININE: 1.3 mg/dL (ref 0.4–1.5)
Calcium: 9.6 mg/dL (ref 8.4–10.5)
GFR: 80.12 mL/min (ref >60.00)
Glucose, Bld: 92 mg/dL (ref 70–99)
Potassium: 4.2 mEq/L (ref 3.5–5.1)
Sodium: 138 mEq/L (ref 135–145)

## 2014-09-05 LAB — LUTEINIZING HORMONE: LH: 3.74 m[IU]/mL (ref 1.50–9.30)

## 2014-09-05 LAB — FOLLICLE STIMULATING HORMONE: FSH: 6 m[IU]/mL (ref 1.4–18.1)

## 2014-09-05 LAB — HIV ANTIBODY (ROUTINE TESTING W REFLEX): HIV 1&2 Ab, 4th Generation: NONREACTIVE

## 2014-09-06 LAB — TESTOSTERONE, FREE, TOTAL, SHBG
Sex Hormone Binding: 30 nmol/L (ref 13–71)
TESTOSTERONE-% FREE: 2 % (ref 1.6–2.9)
TESTOSTERONE: 256 ng/dL — AB (ref 300–890)
Testosterone, Free: 52.4 pg/mL (ref 47.0–244.0)

## 2014-09-12 ENCOUNTER — Encounter: Payer: Self-pay | Admitting: Internal Medicine

## 2014-09-27 ENCOUNTER — Ambulatory Visit (INDEPENDENT_AMBULATORY_CARE_PROVIDER_SITE_OTHER): Payer: No Typology Code available for payment source | Admitting: Internal Medicine

## 2014-09-27 ENCOUNTER — Encounter: Payer: Self-pay | Admitting: Internal Medicine

## 2014-09-27 VITALS — BP 120/80 | HR 49 | Temp 98.2°F | Wt 207.0 lb

## 2014-09-27 DIAGNOSIS — R0789 Other chest pain: Secondary | ICD-10-CM

## 2014-09-27 DIAGNOSIS — M952 Other acquired deformity of head: Secondary | ICD-10-CM | POA: Insufficient documentation

## 2014-09-27 DIAGNOSIS — R002 Palpitations: Secondary | ICD-10-CM | POA: Insufficient documentation

## 2014-09-27 NOTE — Assessment & Plan Note (Signed)
ECHO Card consult Pt is working out a lot - no exertional sx's

## 2014-09-27 NOTE — Progress Notes (Signed)
Subjective:     Chest Pain  This is a new problem. The current episode started 1 to 4 weeks ago. The onset quality is sudden. The problem has been resolved. The pain is present in the substernal region. The pain is moderate. The quality of the pain is described as heavy. Associated symptoms include palpitations and shortness of breath. Pertinent negatives include no back pain, cough, dizziness, nausea or weakness.  Pertinent negatives for past medical history include no aneurysm.  2 wks ago after a party  Father had a hypertrophic cardiomyopathy  Exercising 4 times a week.  F/u migraines triggered by alcohol or eating after eating after a work out x2 wks - resolved 2 wks ago  BP Readings from Last 3 Encounters:  09/27/14 120/80  05/19/14 120/80  05/04/13 108/62   Wt Readings from Last 3 Encounters:  09/27/14 207 lb (93.895 kg)  05/19/14 209 lb (94.802 kg)  05/04/13 221 lb (100.245 kg)      .   Review of Systems  Constitutional: Negative for appetite change, fatigue and unexpected weight change.  HENT: Negative for congestion, nosebleeds, sneezing, sore throat and trouble swallowing.   Eyes: Negative for itching and visual disturbance.  Respiratory: Positive for shortness of breath. Negative for cough.   Cardiovascular: Positive for palpitations. Negative for chest pain and leg swelling.  Gastrointestinal: Negative for nausea, diarrhea, blood in stool and abdominal distention.  Genitourinary: Negative for frequency and hematuria.  Musculoskeletal: Negative for back pain, joint swelling, gait problem and neck pain.  Skin: Negative for rash.  Neurological: Negative for dizziness, tremors, speech difficulty and weakness.  Psychiatric/Behavioral: Negative for sleep disturbance, dysphoric mood and agitation. The patient is not nervous/anxious.        Objective:   Physical Exam  Constitutional: He is oriented to person, place, and time. He appears well-developed. No  distress.  NAD  HENT:  Mouth/Throat: Oropharynx is clear and moist.  Eyes: Conjunctivae are normal. Pupils are equal, round, and reactive to light.  Neck: Normal range of motion. No JVD present. No thyromegaly present.  Cardiovascular: Normal rate, regular rhythm, normal heart sounds and intact distal pulses.  Exam reveals no gallop and no friction rub.   No murmur heard. Pulmonary/Chest: Effort normal and breath sounds normal. No respiratory distress. He has no wheezes. He has no rales. He exhibits no tenderness.  Abdominal: Soft. Bowel sounds are normal. He exhibits no distension and no mass. There is no tenderness. There is no rebound and no guarding.  Musculoskeletal: Normal range of motion. He exhibits no edema or tenderness.  Lymphadenopathy:    He has no cervical adenopathy.  Neurological: He is alert and oriented to person, place, and time. He has normal reflexes. No cranial nerve deficit. He exhibits normal muscle tone. He displays a negative Romberg sign. Coordination and gait normal.  No meningeal signs  Skin: Skin is warm and dry. No rash noted.  Psychiatric: He has a normal mood and affect. His behavior is normal. Judgment and thought content normal.  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

## 2014-09-27 NOTE — Assessment & Plan Note (Addendum)
11/15 ?PSVT vs other Father had a hypertrophic cardiomyopathy  ECHO  Cardiol ref

## 2014-09-27 NOTE — Progress Notes (Signed)
Pre visit review using our clinic review tool, if applicable. No additional management support is needed unless otherwise documented below in the visit note. 

## 2014-09-27 NOTE — Assessment & Plan Note (Signed)
11/15 a V shape defect on R fronto-temporal scull 3 and 3 cm X ray

## 2014-10-04 ENCOUNTER — Other Ambulatory Visit: Payer: Self-pay | Admitting: *Deleted

## 2014-10-04 DIAGNOSIS — M952 Other acquired deformity of head: Secondary | ICD-10-CM

## 2014-10-07 ENCOUNTER — Ambulatory Visit (HOSPITAL_COMMUNITY): Payer: No Typology Code available for payment source | Attending: Internal Medicine | Admitting: Cardiology

## 2014-10-07 ENCOUNTER — Ambulatory Visit (INDEPENDENT_AMBULATORY_CARE_PROVIDER_SITE_OTHER)
Admission: RE | Admit: 2014-10-07 | Discharge: 2014-10-07 | Disposition: A | Discharge status: Home / Self Care | Payer: No Typology Code available for payment source | Source: Ambulatory Visit | Attending: Internal Medicine | Admitting: Internal Medicine

## 2014-10-07 DIAGNOSIS — R002 Palpitations: Secondary | ICD-10-CM

## 2014-10-07 DIAGNOSIS — R0789 Other chest pain: Secondary | ICD-10-CM | POA: Insufficient documentation

## 2014-10-07 DIAGNOSIS — M952 Other acquired deformity of head: Secondary | ICD-10-CM

## 2014-10-07 NOTE — Progress Notes (Signed)
Echo performed. 

## 2014-10-21 ENCOUNTER — Ambulatory Visit: Payer: No Typology Code available for payment source | Admitting: Cardiology

## 2014-12-19 ENCOUNTER — Ambulatory Visit (INDEPENDENT_AMBULATORY_CARE_PROVIDER_SITE_OTHER): Payer: No Typology Code available for payment source | Admitting: Cardiology

## 2014-12-19 ENCOUNTER — Encounter: Payer: Self-pay | Admitting: Cardiology

## 2014-12-19 VITALS — BP 126/86 | HR 68 | Ht 74.0 in | Wt 216.0 lb

## 2014-12-19 DIAGNOSIS — R002 Palpitations: Secondary | ICD-10-CM

## 2014-12-19 DIAGNOSIS — R0789 Other chest pain: Secondary | ICD-10-CM

## 2014-12-19 NOTE — Progress Notes (Signed)
[address] [place], [place]  [address] Phone: [phone] Fax:  [phone]) [phone]  Date:  12/19/2014   ID:  Marcina MillardBarrett Kohlmann, DOB 15-Nov-1971, MRN [id number]  PCP:  Sonda PrimesAlex Plotnikov, MD   History of Present Illness: Marcina MillardBarrett Albert is a 44 y.o. male here for evaluation of prior episode of chest pain. Substernal, moderate, heaviness associated with prior night of staying out late. He has not had any further symptoms. No real palpitations currently. Father had hypertrophic cardiomyopathy and after that diagnosis was made approximate 5 years ago, he underwent an echocardiogram as well as stress test both of which were normal. Reassuring. He is continuing to exercise without difficulty. Over the last 2 years he has lost 20-30 pounds, eats very well. He lifts weight. He has not had any syncopal symptoms or high risk symptoms.  Echocardiogram from 10/07/14 reviewed. Wall thickness was 12.5 mm which is in the mild LVH category.  He was also concerned about a ridge on his right cranium. CT scan was reassuring. His wife noticed this.   Wt Readings from Last 3 Encounters:  12/19/14 216 lb (97.977 kg)  09/27/14 207 lb (93.895 kg)  05/19/14 209 lb (94.802 kg)     Past Medical History  Diagnosis Date  . Herpes genitalis in men   . Bilateral optic neuritis     on MRI  . Lyme disease 08/2007  . Tinea corporis   . Eczema   . Rhinitis   . DM (diabetes mellitus)   . Hyperlipidemia   . Substance abuse   . Alcoholism     Past Surgical History  Procedure Laterality Date  . Tonsillectomy      Current Outpatient Prescriptions  Medication Sig Dispense Refill  . Misc Natural Products (NF FORMULAS TESTOSTERONE) CAPS Take by mouth daily.    . Multiple Vitamin (MULTIVITAMIN) tablet Take 1 tablet by mouth daily.    . Omega-3 Fatty Acids (FISH OIL) 1200 MG CAPS Take 1 capsule by mouth 2 (two) times daily.     No current facility-administered medications for this visit.     Allergies:   No Known Allergies  Social History:  The patient  reports that he has quit smoking. His smoking use included Cigarettes. He has never used smokeless tobacco. He reports that he does not drink alcohol or use illicit drugs.   Family History  Problem Relation Age of Onset  . Lung cancer Mother 3571    and brain cancer  . Heart disease Father [id number]    arrhythmia  . Rectal cancer Sister [id number]  . Hypertension Brother   . Hypertension Father   . Diabetes Mother     ROS:  Please see the history of present illness.   Denies any syncope, bleeding, orthopnea, PND, rash, strokelike symptoms   All other systems reviewed and negative.   PHYSICAL EXAM: VS:  BP 126/86 mmHg  Pulse 68  Ht 6\' 2"  (1.88 m)  Wt 216 lb (97.977 kg)  BMI 27.72 kg/m2 Well nourished, well developed, in no acute distress HEENT: normal, [place]/AT, EOMI Neck: no JVD, normal carotid upstroke, no bruit Cardiac:  normal S1, S2; RRR; no murmur Lungs:  clear to auscultation bilaterally, no wheezing, rhonchi or rales Abd: soft, nontender, no hepatomegaly, no bruits Ext: no edema, 2+ distal pulses Skin: warm and dry GU: deferred Neuro: no focal abnormalities noted, AAO x 3  EKG:  12/19/14-sinus rhythm, 68, J-point elevation V2, nonspecific ST-T wave changes, borderline LVH.  ECHO: 10/07/14-- Left ventricle: The cavity size was normal. Systolic function wasnormal. The estimated ejection fraction was in the range of 55% to 60%. Wall motion was normal; there were no regional wall motion abnormalities. Left ventricular diastolic function parameters were normal.   Mild LVH noted, 12.5 cm.  Labs: LDL 108, hemoglobin 15.7, creatinine 1.3  ASSESSMENT AND PLAN:  1. Atypical chest pain-he has not had any recent bouts of chest discomfort. He works out quite vigorously with no high risk symptoms. No syncope. No palpitations currently. He had an exercise treadmill test approximate 5 years ago after his father died from  hypertrophic cardiomyopathy. This test was normal. He continues to work hard on primary prevention. 2. Palpitations-not an issue currently. Feels well. 3. We will see him back in one year for prevention visit.  Signed, Donato Schultz, MD [facility]  12/19/2014 3:15 PM

## 2014-12-19 NOTE — Patient Instructions (Signed)
The current medical regimen is effective;  continue present plan and medications.  Follow up in 1 year with Dr. Skains.  You will receive a letter in the mail 2 months before you are due.  Please call us when you receive this letter to schedule your follow up appointment.  Thank you for choosing [facility]!!     

## 2015-08-21 ENCOUNTER — Encounter (HOSPITAL_COMMUNITY): Payer: Self-pay | Admitting: Cardiology

## 2015-08-21 ENCOUNTER — Encounter: Payer: Self-pay | Admitting: Cardiology

## 2015-08-21 ENCOUNTER — Emergency Department (HOSPITAL_COMMUNITY)
Admission: EM | Admit: 2015-08-21 | Discharge: 2015-08-21 | Disposition: A | Discharge status: Home / Self Care | Payer: 59 | Attending: Emergency Medicine | Admitting: Emergency Medicine

## 2015-08-21 DIAGNOSIS — Z87891 Personal history of nicotine dependence: Secondary | ICD-10-CM | POA: Diagnosis not present

## 2015-08-21 DIAGNOSIS — E119 Type 2 diabetes mellitus without complications: Secondary | ICD-10-CM | POA: Insufficient documentation

## 2015-08-21 DIAGNOSIS — Z8709 Personal history of other diseases of the respiratory system: Secondary | ICD-10-CM | POA: Insufficient documentation

## 2015-08-21 DIAGNOSIS — Y92310 Basketball court as the place of occurrence of the external cause: Secondary | ICD-10-CM | POA: Diagnosis not present

## 2015-08-21 DIAGNOSIS — Y999 Unspecified external cause status: Secondary | ICD-10-CM | POA: Diagnosis not present

## 2015-08-21 DIAGNOSIS — Z872 Personal history of diseases of the skin and subcutaneous tissue: Secondary | ICD-10-CM | POA: Diagnosis not present

## 2015-08-21 DIAGNOSIS — S99912A Unspecified injury of left ankle, initial encounter: Secondary | ICD-10-CM | POA: Diagnosis present

## 2015-08-21 DIAGNOSIS — Z79899 Other long term (current) drug therapy: Secondary | ICD-10-CM | POA: Insufficient documentation

## 2015-08-21 DIAGNOSIS — S9002XA Contusion of left ankle, initial encounter: Secondary | ICD-10-CM | POA: Insufficient documentation

## 2015-08-21 DIAGNOSIS — X58XXXA Exposure to other specified factors, initial encounter: Secondary | ICD-10-CM | POA: Diagnosis not present

## 2015-08-21 DIAGNOSIS — Z8619 Personal history of other infectious and parasitic diseases: Secondary | ICD-10-CM | POA: Diagnosis not present

## 2015-08-21 DIAGNOSIS — S86002A Unspecified injury of left Achilles tendon, initial encounter: Secondary | ICD-10-CM | POA: Diagnosis not present

## 2015-08-21 DIAGNOSIS — Y9367 Activity, basketball: Secondary | ICD-10-CM | POA: Insufficient documentation

## 2015-08-21 DIAGNOSIS — Z8669 Personal history of other diseases of the nervous system and sense organs: Secondary | ICD-10-CM | POA: Insufficient documentation

## 2015-08-21 DIAGNOSIS — S86012A Strain of left Achilles tendon, initial encounter: Secondary | ICD-10-CM

## 2015-08-21 MED ORDER — HYDROCODONE-ACETAMINOPHEN 5-325 MG PO TABS: 1.0000 | ORAL_TABLET | ORAL | Status: DC | PRN

## 2015-08-21 NOTE — ED Provider Notes (Signed)
CSN: [id number]     Arrival date & time 08/21/15  1148 History  This chart was scribed for non-physician practitioner, Trixie Dredge, PA-C, working with Elwin Mocha, MD by Marica Otter, ED Scribe. This patient was seen in room TR05C/TR05C and the patient's care was started at 3:11 PM.   Chief Complaint  Patient presents with  . Leg Pain   HPI PCP: Sonda Primes, MD HPI Comments: Timothy Petersen is a 44 y.o. male, with PMHx noted below, who presents to the Emergency Department complaining of traumatic, sudden onset, unimproved left leg pain with associated swelling onset 4 days ago while pt was playing basketball and he heard and felt a snap in his left posterior ankle. Pt specifies that it felt like "someone hit my ankle." Pt further notes that this was the first time he played basketball in quite some time. Pt reports using  of ibuprofen, icing, and elevating his leg at home without relief.  Notes significant swelling and small area of bruising. Pt denies numbness or weakness of BLE. Denies other injury.  Past Medical History  Diagnosis Date  . Herpes genitalis in men   . Bilateral optic neuritis     on MRI  . Lyme disease 08/2007  . Tinea corporis   . Eczema   . Rhinitis   . DM (diabetes mellitus)   . Hyperlipidemia   . Substance abuse   . Alcoholism    Past Surgical History  Procedure Laterality Date  . Tonsillectomy     Family History  Problem Relation Age of Onset  . Lung cancer Mother 42    and brain cancer  . Heart disease Father 72    arrhythmia  . Rectal cancer Sister 51  . Hypertension Brother   . Hypertension Father   . Diabetes Mother    Social History  Substance Use Topics  . Smoking status: Former Smoker    Types: Cigarettes  . Smokeless tobacco: Never Used  . Alcohol Use: No    Review of Systems  Constitutional: Negative for fever.  Respiratory: Negative for shortness of breath.   Cardiovascular: Positive for leg swelling. Negative for chest  pain.  Musculoskeletal: Positive for joint swelling (left ankle) and arthralgias ( left leg pain).  Skin: Positive for color change.  Allergic/Immunologic: Negative for immunocompromised state.  Neurological: Negative for weakness and numbness.  Hematological: Does not bruise/bleed easily.  Psychiatric/Behavioral: Negative for self-injury.   Allergies  Review of patient's allergies indicates no known allergies.  Home Medications   Prior to Admission medications   Medication Sig Start Date End Date Taking? Authorizing Provider  Misc Natural Products (NF FORMULAS TESTOSTERONE) CAPS Take by mouth daily.    Historical Provider, MD  Multiple Vitamin (MULTIVITAMIN) tablet Take 1 tablet by mouth daily.    Historical Provider, MD  Omega-3 Fatty Acids (FISH OIL) 1200 MG CAPS Take 1 capsule by mouth 2 (two) times daily.    Historical Provider, MD   Triage Vitals: BP 139/69 mmHg  Pulse 50  Temp(Src) 97.9 F (36.6 C) (Oral)  Resp 18  SpO2 99% Physical Exam  Constitutional: He appears well-developed and well-nourished. No distress.  HENT:  Head: Normocephalic and atraumatic.  Neck: Neck supple.  Pulmonary/Chest: Effort normal.  Musculoskeletal:  Janee Morn test is positive. Soft, bogginess in left posterior ankle. No masses palpated.  Calf is edematous. Some ecchymosis over the medial ankle inferior to the malleolus. Distal pulses and sensation intact.   Neurological: He is alert. He exhibits normal muscle  tone.  Skin: He is not diaphoretic.  Psychiatric: He has a normal mood and affect. His behavior is normal.  Nursing note and vitals reviewed.  ED Course  Procedures (including critical care time) DIAGNOSTIC STUDIES: Oxygen Saturation is 99% on ra, nl by my interpretation.    COORDINATION OF CARE: 3:14 PM: Discussed treatment plan which includes splint placement, ortho referral, pain meds, and crutches with pt at bedside; patient verbalizes understanding and agrees with treatment  plan. 3:33 PM: Consult with ortho tech.   MDM   Final diagnoses:  Achilles tendon tear, left, initial encounter    AFebrile nontoxic patient with pain in his left calf that occurred while playing basketball 5 days ago.  Concern for partially torn achilles tendon.  Placed in posterior splint, crutches, d/c with norco, close orthopedic follow up.  Discussed result, findings, treatment, and follow up  with patient.  Pt given return precautions.  Pt verbalizes understanding and agrees with plan.      I personally performed the services described in this documentation, which was scribed in my presence. The recorded information has been reviewed and is accurate.    Trixie Dredge, PA-C 08/21/15 1730  Elwin Mocha, MD 08/22/15 1254

## 2015-08-21 NOTE — ED Notes (Signed)
Pt reports left leg pain that started Wednesday after playing basketball. Reports swelling to the calf area.

## 2015-08-21 NOTE — Discharge Instructions (Signed)
Read the information below.  Use the prescribed medication as directed.  Please discuss all new medications with your pharmacist.  Do not take additional tylenol while taking the prescribed pain medication to avoid overdose.  You may return to the Emergency Department at any time for worsening condition or any new symptoms that concern you.  If you develop uncontrolled pain, weakness or numbness of the extremity, severe discoloration of the skin, or you are unable to move your toes, return to the ER for a recheck.       Partial or Complete Achilles Tendon Rupture A complete tear in the Achilles tendon is known as an Achilles tendon rupture. The Achilles tendon, also known as the heel cord, connects the large calf muscles (gastrocnemius and soleus) to the heel bone (calcaneus) and is essential for proper functioning of the calf muscles. The calf muscles are required for pushing the foot downward and are necessary for walking, running, and jumping. SYMPTOMS  "Pop" or rip heard or felt at the back of the heel at the time of injury.  Pain and weakness when moving the foot (especially when pushing down with the front of the foot). If you have ruptured the tendon completely, you will not be able to rise on your toes on the injured leg. The pain can sometimes be severe. With a partial rupture, you may still be able to move your foot, and you may experience only minor pain and swelling.  Tenderness, swelling, warmth, and redness around the Achilles tendon.  Bruising at the Achilles tendon and heel after 48 hours. CAUSES  Achilles tendon rupture is most commonly caused by a sudden force placed upon the Achilles tendon that is greater than the tendon can withstand (jumping, hurdling, or sprinting).  Achilles tendon rupture may also occur from direct trauma or injury to the lower leg, foot, or ankle. RISK INCREASES WITH:  Sports that require sudden, explosive muscle contraction, such as those involving  jumping and quick starts, and running or contact sports.  Poor strength and flexibility.  Previous Achilles tendon injury.  Untreated Achilles tendinitis.  Corticosteroid injection into the Achilles tendon.  Medical conditions, such as decreased circulation due to any cardiovascular medical problem or obesity. PREVENTION  Warm up and stretch properly before activity.  Allow for adequate rest and recovery between activities.  Maintain physical fitness:  Ankle and leg flexibility.  Muscle strength and endurance.  Cardiovascular fitness.  Taping, protective strapping, or an adhesive bandage may be recommended before practice or competition. PROGNOSIS If treated properly, Achilles tendon ruptures are usually curable in 4 to 9 months. RELATED COMPLICATIONS   Weakness of the calf muscles can occur, especially if the rupture goes untreated.  Repeat rupture of the tendon is possible even after treatment.  Prolonged disability can occur.  Risks of surgery include infection, bleeding, injury to nerves, and impaired wound healing. TREATMENT  Initial treatment involves removing all weight from the affected leg. Ice, medication, compression bandages, and elevation of the leg may be used to help reduce pain and inflammation.  Definitive treatment options include: surgical treatment for a complete tear, and nonsurgical treatment for a partial tear. The return to sports is usually about the same with either treatment course but can occur a few weeks sooner with surgery.  Nonsurgical treatment can be used only for a partial rupture of the tendon. The affected leg is placed in a long cast (from foot to groin) to immobilize the injured tendon and allow for healing. The  leg is kept in a cast for 4 to 9 weeks followed by immobilization in a walking boot for an additional 4 to 12 weeks. After immobilization, strengthening and stretching exercises are recommended to regain strength and a full  range of motion. Nonsurgical treatment does not involve the risks associated with surgery (infection, bleeding, or nerve injury). However, the recovery period is usually longer than with surgical intervention, and there is a higher risk of repeat rupture of the tendon.  Medication  If pain medication is necessary, nonsteroidal anti-inflammatory medications, such as aspirin and ibuprofen, or other minor pain relievers, such as acetaminophen, are often recommended.  Do not take pain medication for 7 days before surgery.  Prescription pain relievers may be given by a caregiver. Use only as directed and only as much as you need.  Surgical treatment is performed to reattach the tendon to the heel bone (calcaneus) with sutures. After surgery, the lower leg and foot are immobilized in a cast. After immobilization, strengthening and stretching exercises are recommended to regain strength and a full range of motion. The advantages of surgical intervention are shorter recovery, no need to immobilize the knee, lower risk of repeat rupture, and a stronger calf muscle after injury. The disadvantages include the risks of surgery, such as impaired wound healing, nerve injury, and infection. SEEK IMMEDIATE MEDICAL CARE IF:  Pain increases, despite treatment.  Cast discomfort develops.  New, unexplained symptoms develop (drugs used in treatment may produce side effects).

## 2015-08-21 NOTE — Progress Notes (Signed)
Orthopedic Tech Progress Note Patient Details:  Timothy Petersen [birth year] [id number]  Ortho Devices Type of Ortho Device: Ace wrap, Crutches, Post (short leg) splint Ortho Device/Splint Location: lle Ortho Device/Splint Interventions: Application   Crawford, Rembert 08/21/2015, 3:54 PM

## 2015-08-23 ENCOUNTER — Other Ambulatory Visit (HOSPITAL_COMMUNITY): Payer: Self-pay | Admitting: Orthopaedic Surgery

## 2015-08-25 ENCOUNTER — Encounter (HOSPITAL_BASED_OUTPATIENT_CLINIC_OR_DEPARTMENT_OTHER): Payer: Self-pay | Admitting: *Deleted

## 2015-08-27 MED ORDER — CEFAZOLIN SODIUM-DEXTROSE 2-3 GM-% IV SOLR
2.0000 g | INTRAVENOUS | Status: AC
  Administered 2015-08-28: 2 g via INTRAVENOUS
  Filled 2015-08-27: qty 50

## 2015-08-28 ENCOUNTER — Ambulatory Visit (HOSPITAL_COMMUNITY): Payer: 59

## 2015-08-28 ENCOUNTER — Encounter (HOSPITAL_COMMUNITY)
Admission: RE | Disposition: A | Discharge status: Home / Self Care | Payer: Self-pay | Source: Ambulatory Visit | Attending: Orthopaedic Surgery

## 2015-08-28 ENCOUNTER — Ambulatory Visit (HOSPITAL_BASED_OUTPATIENT_CLINIC_OR_DEPARTMENT_OTHER)
Admission: RE | Admit: 2015-08-28 | Discharge: 2015-08-28 | Disposition: A | Discharge status: Home / Self Care | Payer: 59 | Source: Ambulatory Visit | Attending: Orthopaedic Surgery | Admitting: Orthopaedic Surgery

## 2015-08-28 ENCOUNTER — Ambulatory Visit (HOSPITAL_COMMUNITY): Payer: 59 | Admitting: Anesthesiology

## 2015-08-28 ENCOUNTER — Encounter (HOSPITAL_COMMUNITY): Payer: Self-pay | Admitting: Certified Registered Nurse Anesthetist

## 2015-08-28 DIAGNOSIS — E785 Hyperlipidemia, unspecified: Secondary | ICD-10-CM | POA: Insufficient documentation

## 2015-08-28 DIAGNOSIS — X58XXXA Exposure to other specified factors, initial encounter: Secondary | ICD-10-CM | POA: Diagnosis not present

## 2015-08-28 DIAGNOSIS — S86012A Strain of left Achilles tendon, initial encounter: Secondary | ICD-10-CM | POA: Diagnosis not present

## 2015-08-28 DIAGNOSIS — Y9367 Activity, basketball: Secondary | ICD-10-CM | POA: Diagnosis not present

## 2015-08-28 DIAGNOSIS — Z87891 Personal history of nicotine dependence: Secondary | ICD-10-CM | POA: Insufficient documentation

## 2015-08-28 DIAGNOSIS — S86019A Strain of unspecified Achilles tendon, initial encounter: Secondary | ICD-10-CM

## 2015-08-28 HISTORY — PX: ACHILLES TENDON SURGERY: SHX542

## 2015-08-28 LAB — BASIC METABOLIC PANEL
Anion gap: 6 (ref 5–15)
BUN: 9 mg/dL (ref 6–20)
CALCIUM: 9 mg/dL (ref 8.9–10.3)
CO2: 25 mmol/L (ref 22–32)
CREATININE: 0.89 mg/dL (ref 0.61–1.24)
Chloride: 108 mmol/L (ref 101–111)
GFR calc Af Amer: >60 mL/min (ref >60)
GLUCOSE: 98 mg/dL (ref 65–99)
Potassium: 4.5 mmol/L (ref 3.5–5.1)
Sodium: 139 mmol/L (ref 135–145)

## 2015-08-28 LAB — CBC
HCT: 41.8 % (ref 39.0–52.0)
Hemoglobin: 14 g/dL (ref 13.0–17.0)
MCH: 29.9 pg (ref 26.0–34.0)
MCHC: 33.5 g/dL (ref 30.0–36.0)
MCV: 89.3 fL (ref 78.0–100.0)
Platelets: 149 10*3/uL — ABNORMAL LOW (ref 150–400)
RBC: 4.68 MIL/uL (ref 4.22–5.81)
RDW: 14 % (ref 11.5–15.5)
WBC: 6 10*3/uL (ref 4.0–10.5)

## 2015-08-28 SURGERY — REPAIR, TENDON, ACHILLES
Anesthesia: Regional | Site: Foot | Laterality: Left

## 2015-08-28 MED ORDER — GLYCOPYRROLATE 0.2 MG/ML IJ SOLN
INTRAMUSCULAR | Status: DC | PRN
  Administered 2015-08-28: 0.6 mg via INTRAVENOUS

## 2015-08-28 MED ORDER — OXYCODONE-ACETAMINOPHEN 5-325 MG PO TABS: 1.0000 | ORAL_TABLET | ORAL | Status: AC | PRN

## 2015-08-28 MED ORDER — HYDROMORPHONE HCL 1 MG/ML IJ SOLN: 0.2500 mg | INTRAMUSCULAR | Status: DC | PRN

## 2015-08-28 MED ORDER — BUPIVACAINE HCL (PF) 0.5 % IJ SOLN
INTRAMUSCULAR | Status: DC | PRN
  Administered 2015-08-28: 20 mL via PERINEURAL

## 2015-08-28 MED ORDER — MIDAZOLAM HCL 2 MG/2ML IJ SOLN
INTRAMUSCULAR | Status: AC
  Filled 2015-08-28: qty 4

## 2015-08-28 MED ORDER — FENTANYL CITRATE (PF) 100 MCG/2ML IJ SOLN
INTRAMUSCULAR | Status: AC
  Administered 2015-08-28: 50 ug
  Filled 2015-08-28: qty 2

## 2015-08-28 MED ORDER — OXYCODONE HCL 5 MG/5ML PO SOLN: 5.0000 mg | Freq: Once | ORAL | Status: DC | PRN

## 2015-08-28 MED ORDER — MIDAZOLAM HCL 2 MG/2ML IJ SOLN
INTRAMUSCULAR | Status: AC
  Administered 2015-08-28: 1 mg
  Filled 2015-08-28: qty 2

## 2015-08-28 MED ORDER — FENTANYL CITRATE (PF) 250 MCG/5ML IJ SOLN
INTRAMUSCULAR | Status: AC
  Filled 2015-08-28: qty 5

## 2015-08-28 MED ORDER — ARTIFICIAL TEARS OP OINT
TOPICAL_OINTMENT | OPHTHALMIC | Status: DC | PRN
  Administered 2015-08-28: 1 via OPHTHALMIC

## 2015-08-28 MED ORDER — OXYCODONE HCL 5 MG PO TABS: 5.0000 mg | ORAL_TABLET | Freq: Once | ORAL | Status: DC | PRN

## 2015-08-28 MED ORDER — ONDANSETRON HCL 4 MG/2ML IJ SOLN
INTRAMUSCULAR | Status: DC | PRN
  Administered 2015-08-28: 4 mg via INTRAVENOUS

## 2015-08-28 MED ORDER — LIDOCAINE HCL (CARDIAC) 20 MG/ML IV SOLN
INTRAVENOUS | Status: AC
  Filled 2015-08-28: qty 5

## 2015-08-28 MED ORDER — FENTANYL CITRATE (PF) 100 MCG/2ML IJ SOLN
INTRAMUSCULAR | Status: DC | PRN
  Administered 2015-08-28: 50 ug via INTRAVENOUS

## 2015-08-28 MED ORDER — LIDOCAINE HCL (CARDIAC) 20 MG/ML IV SOLN
INTRAVENOUS | Status: DC | PRN
  Administered 2015-08-28: 100 mg via INTRAVENOUS

## 2015-08-28 MED ORDER — GLYCOPYRROLATE 0.2 MG/ML IJ SOLN
INTRAMUSCULAR | Status: AC
  Filled 2015-08-28: qty 3

## 2015-08-28 MED ORDER — NEOSTIGMINE METHYLSULFATE 10 MG/10ML IV SOLN
INTRAVENOUS | Status: DC | PRN
  Administered 2015-08-28: 4 mg via INTRAVENOUS

## 2015-08-28 MED ORDER — ARTIFICIAL TEARS OP OINT
TOPICAL_OINTMENT | OPHTHALMIC | Status: AC
  Filled 2015-08-28: qty 3.5

## 2015-08-28 MED ORDER — ONDANSETRON HCL 4 MG/2ML IJ SOLN: 4.0000 mg | Freq: Once | INTRAMUSCULAR | Status: DC | PRN

## 2015-08-28 MED ORDER — LIDOCAINE-EPINEPHRINE (PF) 1.5 %-1:200000 IJ SOLN
INTRAMUSCULAR | Status: DC | PRN
  Administered 2015-08-28: 10 mL via PERINEURAL

## 2015-08-28 MED ORDER — PROPOFOL 10 MG/ML IV BOLUS
INTRAVENOUS | Status: AC
  Filled 2015-08-28: qty 20

## 2015-08-28 MED ORDER — NEOSTIGMINE METHYLSULFATE 10 MG/10ML IV SOLN
INTRAVENOUS | Status: AC
  Filled 2015-08-28: qty 1

## 2015-08-28 MED ORDER — CHLORHEXIDINE GLUCONATE 4 % EX LIQD: 60.0000 mL | Freq: Once | CUTANEOUS | Status: DC

## 2015-08-28 MED ORDER — 0.9 % SODIUM CHLORIDE (POUR BTL) OPTIME
TOPICAL | Status: DC | PRN
  Administered 2015-08-28: 1000 mL

## 2015-08-28 MED ORDER — ROCURONIUM BROMIDE 100 MG/10ML IV SOLN
INTRAVENOUS | Status: DC | PRN
  Administered 2015-08-28: 50 mg via INTRAVENOUS

## 2015-08-28 MED ORDER — ONDANSETRON HCL 4 MG/2ML IJ SOLN
INTRAMUSCULAR | Status: AC
  Filled 2015-08-28: qty 2

## 2015-08-28 MED ORDER — LACTATED RINGERS IV SOLN
INTRAVENOUS | Status: DC | PRN
  Administered 2015-08-28: 13:00:00 via INTRAVENOUS

## 2015-08-28 MED ORDER — LACTATED RINGERS IV SOLN
INTRAVENOUS | Status: DC
  Administered 2015-08-28: 10 mL/h via INTRAVENOUS

## 2015-08-28 MED ORDER — PROPOFOL 10 MG/ML IV BOLUS
INTRAVENOUS | Status: DC | PRN
  Administered 2015-08-28: 50 mg via INTRAVENOUS
  Administered 2015-08-28: 150 mg via INTRAVENOUS

## 2015-08-28 SURGICAL SUPPLY — 53 items
BANDAGE ELASTIC 6 VELCRO ST LF (GAUZE/BANDAGES/DRESSINGS) ×2 IMPLANT
BLADE SURG 10 STRL SS (BLADE) ×2 IMPLANT
BNDG CMPR 9X4 STRL LF SNTH (GAUZE/BANDAGES/DRESSINGS) ×1
BNDG ESMARK 4X9 LF (GAUZE/BANDAGES/DRESSINGS) ×2 IMPLANT
COVER MAYO STAND STRL (DRAPES) IMPLANT
CUFF TOURNIQUET SINGLE 34IN LL (TOURNIQUET CUFF) ×2 IMPLANT
CUFF TOURNIQUET SINGLE 44IN (TOURNIQUET CUFF) IMPLANT
DRAPE INCISE IOBAN 66X45 STRL (DRAPES) ×2 IMPLANT
DRAPE U-SHAPE 47X51 STRL (DRAPES) ×2 IMPLANT
DRSG ADAPTIC 3X8 NADH LF (GAUZE/BANDAGES/DRESSINGS) ×2 IMPLANT
DRSG PAD ABDOMINAL 8X10 ST (GAUZE/BANDAGES/DRESSINGS) ×2 IMPLANT
DURAPREP 26ML APPLICATOR (WOUND CARE) ×2 IMPLANT
ELECT REM PT RETURN 9FT ADLT (ELECTROSURGICAL) ×2
ELECTRODE REM PT RTRN 9FT ADLT (ELECTROSURGICAL) ×1 IMPLANT
GAUZE SPONGE 4X4 12PLY STRL (GAUZE/BANDAGES/DRESSINGS) ×2 IMPLANT
GAUZE XEROFORM 1X8 LF (GAUZE/BANDAGES/DRESSINGS) ×2 IMPLANT
GLOVE BIOGEL M 7.0 STRL (GLOVE) ×2 IMPLANT
GLOVE BIOGEL PI IND STRL 8 (GLOVE) ×2 IMPLANT
GLOVE BIOGEL PI INDICATOR 8 (GLOVE) ×2
GLOVE ORTHO TXT STRL SZ7.5 (GLOVE) ×4 IMPLANT
GOWN STRL REUS W/ TWL LRG LVL3 (GOWN DISPOSABLE) IMPLANT
GOWN STRL REUS W/ TWL XL LVL3 (GOWN DISPOSABLE) IMPLANT
GOWN STRL REUS W/TWL 2XL LVL3 (GOWN DISPOSABLE) ×2 IMPLANT
GOWN STRL REUS W/TWL LRG LVL3 (GOWN DISPOSABLE)
GOWN STRL REUS W/TWL XL LVL3 (GOWN DISPOSABLE)
KIT ROOM TURNOVER OR (KITS) ×2 IMPLANT
MANIFOLD NEPTUNE II (INSTRUMENTS) ×2 IMPLANT
NDL SUT 6 .5 CRC .975X.05 MAYO (NEEDLE) IMPLANT
NEEDLE MAYO TAPER (NEEDLE)
NS IRRIG 1000ML POUR BTL (IV SOLUTION) ×2 IMPLANT
PACK ORTHO EXTREMITY (CUSTOM PROCEDURE TRAY) ×2 IMPLANT
PAD ARMBOARD 7.5X6 YLW CONV (MISCELLANEOUS) ×4 IMPLANT
PAD CAST 4YDX4 CTTN HI CHSV (CAST SUPPLIES) ×1 IMPLANT
PADDING CAST COTTON 4X4 STRL (CAST SUPPLIES) ×1
PADDING CAST COTTON 6X4 STRL (CAST SUPPLIES) ×2 IMPLANT
SPLINT FIBERGLASS 4X15 (CAST SUPPLIES) ×2 IMPLANT
SPLINT FIBERGLASS 4X30 (CAST SUPPLIES) ×2 IMPLANT
STRIP CLOSURE SKIN 1/2X4 (GAUZE/BANDAGES/DRESSINGS) ×2 IMPLANT
SUCTION FRAZIER TIP 10 FR DISP (SUCTIONS) ×2 IMPLANT
SUT ETHILON 3 0 PS 1 (SUTURE) ×4 IMPLANT
SUT FIBERWIRE #2 38 REV NDL BL (SUTURE)
SUT FIBERWIRE #2 38 T-5 BLUE (SUTURE) ×4
SUT VIC AB 2-0 CT1 27 (SUTURE) ×2
SUT VIC AB 2-0 CT1 TAPERPNT 27 (SUTURE) ×1 IMPLANT
SUT VIC AB 3-0 FS2 27 (SUTURE) IMPLANT
SUT VICRYL 0 TIES 12 18 (SUTURE) IMPLANT
SUTURE FIBERWR #2 38 T-5 BLUE (SUTURE) ×2 IMPLANT
SUTURE FIBERWR#2 38 REV NDL BL (SUTURE) IMPLANT
TOWEL OR 17X24 6PK STRL BLUE (TOWEL DISPOSABLE) ×2 IMPLANT
TOWEL OR 17X26 10 PK STRL BLUE (TOWEL DISPOSABLE) ×2 IMPLANT
TUBE CONNECTING 12X1/4 (SUCTIONS) ×2 IMPLANT
WATER STERILE IRR 1000ML POUR (IV SOLUTION) ×2 IMPLANT
YANKAUER SUCT BULB TIP NO VENT (SUCTIONS) ×2 IMPLANT

## 2015-08-28 NOTE — Anesthesia Postprocedure Evaluation (Signed)
  Anesthesia Post-op Note  Patient: Timothy Petersen  Procedure(s) Performed: Procedure(s) (LRB): ACHILLES TENDON REPAIR (Left)  Patient Location: PACU  Anesthesia Type: GA combined with regional for post-op pain  Level of Consciousness: awake and alert   Airway and Oxygen Therapy: Patient Spontanous Breathing  Post-op Pain: mild  Post-op Assessment: Post-op Vital signs reviewed, Patient's Cardiovascular Status Stable, Respiratory Function Stable, Patent Airway and No signs of Nausea or vomiting  Last Vitals:  Filed Vitals:   08/28/15 1600  BP:   Pulse: 54  Temp:   Resp: 14    Post-op Vital Signs: stable   Complications: No apparent anesthesia complications

## 2015-08-28 NOTE — Brief Op Note (Signed)
08/28/2015  3:42 PM  PATIENT:  Timothy Petersen  44 y.o. male  PRE-OPERATIVE DIAGNOSIS:  Left Achilles Tendon Rupture  POST-OPERATIVE DIAGNOSIS:  left achilles tendon rupture  PROCEDURE:  Procedure(s): ACHILLES TENDON REPAIR (Left)  SURGEON:  Surgeon(s) and Role:    * Eldred Manges, MD - Primary  PHYSICIAN ASSISTANT: Zonia Kief    ANESTHESIA:   general  EBL:  Total I/O In: 700 [I.V.:700] Out: 20 [Blood:20]  BLOOD ADMINISTERED:none  DRAINS: none   LOCAL MEDICATIONS USED:  NONE  SPECIMEN:  No Specimen  DISPOSITION OF SPECIMEN:  N/A  COUNTS:  YES  TOURNIQUET:   Total Tourniquet Time Documented: Thigh (Left) - 39 minutes Total: Thigh (Left) - 39 minutes   DICTATION: .Reubin Milan Dictation  PLAN OF CARE: Discharge to home after PACU  PATIENT DISPOSITION:  PACU - hemodynamically stable.

## 2015-08-28 NOTE — Anesthesia Procedure Notes (Addendum)
Anesthesia Regional Block:  Popliteal block  Pre-Anesthetic Checklist: ,, timeout performed, Correct Patient, Correct Site, Correct Laterality, Correct Procedure, Correct Position, site marked, Risks and benefits discussed,  Surgical consent,  Pre-op evaluation,  At surgeon's request and post-op pain management  Laterality: Left  Prep: chloraprep       Needles:  Injection technique: Single-shot  Needle Type: Echogenic Needle     Needle Length: 9cm 9 cm Needle Gauge: 21 and 21 G    Additional Needles:  Procedures: ultrasound guided (picture in chart) Popliteal block Narrative:  Injection made incrementally with aspirations every 5 mL.  Performed by: Personally   Additional Notes: Patient tolerated the procedure well without complications   Procedure Name: Intubation Date/Time: 08/28/2015 2:30 PM Performed by: Rise Patience T Pre-anesthesia Checklist: Emergency Drugs available, Suction available, Patient being monitored and Patient identified Patient Re-evaluated:Patient Re-evaluated prior to inductionOxygen Delivery Method: Circle system utilized Preoxygenation: Pre-oxygenation with 100% oxygen Intubation Type: IV induction Ventilation: Mask ventilation without difficulty Laryngoscope Size: Miller and 2 Grade View: Grade II Tube type: Oral Tube size: 7.5 mm Number of attempts: 1 Airway Equipment and Method: Stylet Placement Confirmation: ETT inserted through vocal cords under direct vision,  positive ETCO2 and breath sounds checked- equal and bilateral Secured at: 23 cm Tube secured with: Tape Dental Injury: Teeth and Oropharynx as per pre-operative assessment

## 2015-08-28 NOTE — Transfer of Care (Signed)
Immediate Anesthesia Transfer of Care Note  Patient: Timothy Petersen  Procedure(s) Performed: Procedure(s): ACHILLES TENDON REPAIR (Left)  Patient Location: PACU  Anesthesia Type:General and Regional  Level of Consciousness: awake, alert  and oriented  Airway & Oxygen Therapy: Patient Spontanous Breathing and Patient connected to nasal cannula oxygen  Post-op Assessment: Report given to RN and Post -op Vital signs reviewed and stable  Post vital signs: Reviewed and stable  Last Vitals:  Filed Vitals:   08/28/15 1355  BP:   Pulse: 51  Temp:   Resp: 16    Complications: No apparent anesthesia complications

## 2015-08-28 NOTE — H&P (Signed)
Timothy Petersen is an 44 y.o. male.    HISTORY OF PRESENT ILLNESS:  A 44 year old male playing basketball was going up and suddenly felt a sharp pain in his posterior left ankle.  He thought someone stepped on his ankle or he turned it.  He could not run, limped off the court.  He was seen in the emergency room and diagnosed with an Achilles tendon rupture, date of injury 08/16/2015.  No past history of Achilles tendinitis.  Patient is normally followed by Dr. Trinna Post Plotnikov.  Patient has had ecchymosis over his heel since the tear, palpable defect 6 cm proximal to the calcaneus.  Skin is intact, closed injury.   CURRENT MEDICATIONS:  Patient takes multivitamin, fish oil, and vitamin D.  These are all over-the-counter.   ALLERGIES:  None.   SOCIAL HISTORY:  Patient is separated.  He works as a Programmer, systems.  Does not smoke.  He lives with his sister.  He did smoke for 14 years.  He works out regularly with [employer] in the gym.   FAMILY HISTORY:  Positive for diabetes, heart disease, lung and colon cancer.   FOURTEEN-POINT REVIEW OF SYSTEMS:  Reviewed.  Positive for bronchitis, distant history of mono.     Past Medical History  Diagnosis Date  . Herpes genitalis in men   . Bilateral optic neuritis     on MRI  . Lyme disease 08/2007  . Tinea corporis   . Eczema   . Rhinitis   . Hyperlipidemia   . Substance abuse   . Alcoholism     Past Surgical History  Procedure Laterality Date  . Tonsillectomy      Family History  Problem Relation Age of Onset  . Lung cancer Mother 39    and brain cancer  . Diabetes Mother   . Heart disease Father 61    arrhythmia  . Hypertension Father   . Rectal cancer Sister 80  . Diabetes Sister   . Hypertension Brother   . Diabetes Brother    Social History:  reports that he has quit smoking. His smoking use included Cigarettes. He quit after 14 years of use. He has never used smokeless tobacco. He reports that he does not drink alcohol or  use illicit drugs.  Allergies: No Known Allergies  No prescriptions prior to admission    No results found for this or any previous visit (from the past 48 hour(s)). No results found.  Review of Systems  Constitutional: Negative.   HENT: Negative.   Respiratory: Negative.   Cardiovascular: Negative.   Gastrointestinal: Negative.   Genitourinary: Negative.   Psychiatric/Behavioral: Negative.     Height  (1.88 m), weight 92.987 kg (205 lb). Physical Exam  Constitutional: He is oriented to person, place, and time. He appears well-nourished.  HENT:  Head: Atraumatic.  Eyes: EOM are normal.  Neck: Normal range of motion.  Cardiovascular: Normal rate.   Respiratory: No respiratory distress.  GI: He exhibits no distension.  Neurological: He is alert and oriented to person, place, and time.  Skin: Skin is warm.  Psychiatric: He has a normal mood and affect.    PHYSICAL EXAMINATION:  Patient is 6 feet 2 inches, 206 pounds.  Alert and oriented, WD, WN, NAD.  Extraocular movements intact.  No audible wheezing.  Excellent upper extremity; biceps and triceps are 5+.  No abdominal tenderness.  No rash over exposed skin.  Ecchymosis over the left medial and lateral calcaneus.  Palpable defect Achilles  tendon.  Positive Thompson test.  No active ankle plantarflexion against resistance.  Opposite Achilles tendon shows no palpable defects, no thickening.   ASSESSMENT/DIAGNOSIS:  Acute left Achilles tendon rupture.   PLAN:  We discussed options, operative versus nonoperative treatment, did some diagrams.  We discussed the anatomy, sural nerve, scar problems, healing, risks of infection, potential for re-rupture.  He understands he will be in a splint followed by a cast times a total of 6 weeks then a black CAM boot.  He has an Geophysicist/field seismologist who helps him with real estate.  Operative procedure discussed.  Operative technique discussed.  Postoperative protocol.  All questions answered.  Patient  understands and requests we proceed.    Louellen Haldeman M 08/28/2015, 7:08 AM

## 2015-08-28 NOTE — Anesthesia Preprocedure Evaluation (Addendum)
Anesthesia Evaluation  Patient identified by MRN, date of birth, ID band Patient awake    Reviewed: Allergy & Precautions, NPO status , Patient's Chart, lab work & pertinent test results  Airway Mallampati: II  TM Distance: >3 FB Neck ROM: Full    Dental  (+) Teeth Intact, Dental Advisory Given   Pulmonary former smoker,    breath sounds clear to auscultation       Cardiovascular  Rhythm:Regular Rate:Normal     Neuro/Psych    GI/Hepatic   Endo/Other    Renal/GU      Musculoskeletal   Abdominal   Peds  Hematology   Anesthesia Other Findings   Reproductive/Obstetrics                            Anesthesia Physical Anesthesia Plan  ASA: I  Anesthesia Plan: General and Regional   Post-op Pain Management: GA combined w/ Regional for post-op pain   Induction: Intravenous  Airway Management Planned: Oral ETT  Additional Equipment:   Intra-op Plan:   Post-operative Plan: Extubation in OR  Informed Consent: I have reviewed the patients History and Physical, chart, labs and discussed the procedure including the risks, benefits and alternatives for the proposed anesthesia with the patient or authorized representative who has indicated his/her understanding and acceptance.   Dental advisory given  Plan Discussed with: CRNA and Anesthesiologist  Anesthesia Plan Comments:        Anesthesia Quick Evaluation

## 2015-08-28 NOTE — Interval H&P Note (Signed)
History and Physical Interval Note:  08/28/2015 2:12 PM  Timothy Petersen  has presented today for surgery, with the diagnosis of Left Achilles Tendon Rupture  The various methods of treatment have been discussed with the patient and family. After consideration of risks, benefits and other options for treatment, the patient has consented to  Procedure(s): ACHILLES TENDON REPAIR (Left) as a surgical intervention .  The patient's history has been reviewed, patient examined, no change in status, stable for surgery.  I have reviewed the patient's chart and labs.  Questions were answered to the patient's satisfaction.     Stephfon Bovey C

## 2015-08-29 ENCOUNTER — Encounter (HOSPITAL_COMMUNITY): Payer: Self-pay | Admitting: Orthopaedic Surgery

## 2015-08-29 NOTE — Op Note (Signed)
NAME:  Timothy Petersen, Timothy Petersen             ACCOUNT NO.:  [id number]  MEDICAL RECORD NO.:  [id number]  LOCATION:  [facility]                         FACILITY:  [facility]  PHYSICIAN:  Mattheus Rauls C. Ophelia Charter, M.D.    DATE OF BIRTH:  23-Jun-1971  DATE OF PROCEDURE:  08/28/2015 DATE OF DISCHARGE:  08/28/2015                              OPERATIVE REPORT   PREOPERATIVE DIAGNOSIS:  Left Achilles tendon rupture.  POSTOPERATIVE DIAGNOSIS:  Left Achilles tendon rupture.  PROCEDURE:  Left Achilles tendon repair.  SURGEON:  Solangel Mcmanaway C. Ophelia Charter, M.D.  ASSISTANT:  Genene Churn. Barry Dienes, PA-C, medically necessary and present for the entire procedure.  ANESTHESIA:  General plus preoperative popliteal block.  BRIEF HISTORY:  A 44 year old male ruptured his Achilles tendon last week playing basketball.  Palpable gap in the tendon, positive Thomas test, and inability to plantar flex at the ankle.  PROCEDURE IN DETAIL:  The patient was inducted under general anesthesia orotracheal intubation.  Proximal thigh tourniquet was applied.  The patient was flipped to a prone position, prepping and draping was performed.  Initially, foot was scrubbed to get off some of the Webril that was attached to the heel.  Stockinette extremity sheets and drapes were applied.  Ancef was given prophylactically.  Sterile skin marker was used.  Leg was wrapped with an Esmarch.  Incision was applied just off the midline slightly lateral and initially subcutaneous tissue was bluntly dissected.  After skin was incised and the sural nerve was identified and carefully preserved with the accompanying vein.  Sheath was opened.  Typical findings of shredded Achilles tendon 5-6 cm from the Achilles insertion site.  A #2 FiberWire was used for multiple sutures initially, but the weave was passed followed by Jolly Mango sutures and Kessler sutures intermittently pulling the edges of the tendon together to reapproximate the shredded edges together.  Ankle was able to be  dorsiflexed to the neutral position.  The repair held and total of 8 or 9 #2 FiberWire sutures were placed.  Subcutaneous tissue was reapproximated with 2-0 Vicryl.  Tourniquet was deflated.  Tourniquet time was less than 30 minutes.  Skin was closed with 3-0 nylon.  Postop dressing, splint was applied.  The patient tolerated the procedure well.     [name] C. Ophelia Charter, M.D.    MCY/MEDQ  D:  08/28/2015  T:  08/29/2015  Job:  [id number]

## 2015-09-25 ENCOUNTER — Emergency Department (HOSPITAL_COMMUNITY)
Admission: EM | Admit: 2015-09-25 | Discharge: 2015-09-25 | Disposition: A | Discharge status: Home / Self Care | Payer: 59 | Attending: Emergency Medicine | Admitting: Emergency Medicine

## 2015-09-25 ENCOUNTER — Ambulatory Visit (EMERGENCY_DEPARTMENT_HOSPITAL)
Admission: RE | Admit: 2015-09-25 | Discharge: 2015-09-25 | Disposition: A | Discharge status: Home / Self Care | Payer: 59 | Source: Ambulatory Visit | Attending: Internal Medicine | Admitting: Internal Medicine

## 2015-09-25 ENCOUNTER — Encounter (HOSPITAL_COMMUNITY): Payer: Self-pay | Admitting: *Deleted

## 2015-09-25 ENCOUNTER — Other Ambulatory Visit (HOSPITAL_COMMUNITY): Payer: Self-pay | Admitting: Orthopedic Surgery

## 2015-09-25 DIAGNOSIS — Z87891 Personal history of nicotine dependence: Secondary | ICD-10-CM | POA: Insufficient documentation

## 2015-09-25 DIAGNOSIS — Z79899 Other long term (current) drug therapy: Secondary | ICD-10-CM | POA: Insufficient documentation

## 2015-09-25 DIAGNOSIS — Z872 Personal history of diseases of the skin and subcutaneous tissue: Secondary | ICD-10-CM | POA: Insufficient documentation

## 2015-09-25 DIAGNOSIS — Z8619 Personal history of other infectious and parasitic diseases: Secondary | ICD-10-CM | POA: Insufficient documentation

## 2015-09-25 DIAGNOSIS — J310 Chronic rhinitis: Secondary | ICD-10-CM | POA: Diagnosis not present

## 2015-09-25 DIAGNOSIS — I824Y2 Acute embolism and thrombosis of unspecified deep veins of left proximal lower extremity: Secondary | ICD-10-CM | POA: Diagnosis not present

## 2015-09-25 DIAGNOSIS — M79662 Pain in left lower leg: Secondary | ICD-10-CM | POA: Diagnosis present

## 2015-09-25 DIAGNOSIS — I82402 Acute embolism and thrombosis of unspecified deep veins of left lower extremity: Secondary | ICD-10-CM

## 2015-09-25 DIAGNOSIS — R609 Edema, unspecified: Secondary | ICD-10-CM

## 2015-09-25 DIAGNOSIS — Z8639 Personal history of other endocrine, nutritional and metabolic disease: Secondary | ICD-10-CM | POA: Insufficient documentation

## 2015-09-25 DIAGNOSIS — M7989 Other specified soft tissue disorders: Secondary | ICD-10-CM

## 2015-09-25 DIAGNOSIS — Z8669 Personal history of other diseases of the nervous system and sense organs: Secondary | ICD-10-CM | POA: Insufficient documentation

## 2015-09-25 MED ORDER — XARELTO VTE STARTER PACK 15 & 20 MG PO TBPK: 15.0000 mg | ORAL_TABLET | ORAL | Status: AC

## 2015-09-25 MED ORDER — RIVAROXABAN 15 MG PO TABS
15.0000 mg | ORAL_TABLET | Freq: Once | ORAL | Status: AC
  Administered 2015-09-25: 15 mg via ORAL
  Filled 2015-09-25: qty 1

## 2015-09-25 NOTE — ED Notes (Signed)
Pt c/o left calf pain. Pt had cast removed from othropedic doctors office due to pain. Pt was told he had a possible blood clot in left calf. Pt reports swelling and pain.

## 2015-09-25 NOTE — ED Provider Notes (Signed)
CSN: [id number]     Arrival date & time 09/25/15  1709 History  By signing my name below, I, Jarvis Morganaylor Ferguson, attest that this documentation has been prepared under the direction and in the presence of Levi StraussMercedes Camprubi-Soms, PA-C Electronically Signed: Jarvis Morganaylor Ferguson, ED Scribe. 09/25/2015. 5:41 PM.    Chief Complaint  Patient presents with  . Leg Pain    The history is provided by the patient. No language interpreter was used.    HPI Comments: Timothy Petersen is a 44 y.o. male who presents to the Emergency Department complaining of mild, left calf swelling onset 1 day. He denies any associated pain to the left lower calf. He reports he had surgery on 08/28/15 due to an achilles tendon rupture. Pt states he was seen at his orthopedists office today due to pain from the swelling and he had his cast removed. He reports his orthopedist sent him to have a vascular study done to rule out possible blood clot and the study showed positive DVT. He denies any warmth, redness, chest pain, SOB, fever, chills, abdominal pain, nausea, vomiting, diarrhea, constipation, blood in stool, melena, epistaxis, difficulty urinating, dysuria, hematuria, numbness, tingling, or weakness. Pt reports he is very athletic at baseline and has a low resting heart rate at baseline.  Past Medical History  Diagnosis Date  . Herpes genitalis in men   . Bilateral optic neuritis     on MRI  . Lyme disease 08/2007  . Tinea corporis   . Eczema   . Rhinitis   . Hyperlipidemia   . Substance abuse   . Alcoholism [facility](HCC)    Past Surgical History  Procedure Laterality Date  . Tonsillectomy    . Achilles tendon surgery Left 08/28/2015    Procedure: ACHILLES TENDON REPAIR;  Surgeon: Eldred MangesMark C Yates, MD;  [facility];  Service: Orthopedics;  Laterality: Left;   Family History  Problem Relation Age of Onset  . Lung cancer Mother 5871    and brain cancer  . Diabetes Mother   . Heart disease Father 5364    arrhythmia  .  Hypertension Father   . Rectal cancer Sister 6644  . Diabetes Sister   . Hypertension Brother   . Diabetes Brother    Social History  Substance Use Topics  . Smoking status: Former Smoker -- 14 years    Types: Cigarettes  . Smokeless tobacco: Never Used     Comment: quit 2002  . Alcohol Use: No     Comment: 08/25/15 - sorber 60 days    Review of Systems  Constitutional: Negative for fever and chills.  HENT: Negative for nosebleeds.   Respiratory: Negative for shortness of breath.   Cardiovascular: Positive for leg swelling. Negative for chest pain.  Gastrointestinal: Negative for nausea, vomiting, abdominal pain, diarrhea, constipation and blood in stool.  Genitourinary: Negative for dysuria, hematuria and difficulty urinating.  Musculoskeletal: Negative for myalgias and arthralgias.  Skin: Negative for color change.  Allergic/Immunologic: Negative for immunocompromised state.  Neurological: Negative for weakness and numbness.  Hematological: Does not bruise/bleed easily.  Psychiatric/Behavioral: Negative for confusion.  10 Systems reviewed and all are negative for acute change except as noted in the HPI.      Allergies  Review of patient's allergies indicates no known allergies.  Home Medications   Prior to Admission medications   Medication Sig Start Date End Date Taking? Authorizing Provider  cetirizine (ZYRTEC) 10 MG tablet Take 10 mg by mouth daily as needed for allergies.  Historical Provider, MD  Cholecalciferol (VITAMIN D PO) Take 1 capsule by mouth 2 (two) times daily.    Historical Provider, MD  Omega-3 Fatty Acids (FISH OIL) 1200 MG CAPS Take 1 capsule by mouth 2 (two) times daily.    Historical Provider, MD  oxyCODONE-acetaminophen (ROXICET) 5-325 MG tablet Take 1 tablet by mouth every 4 (four) hours as needed for severe pain. 08/28/15   Naida Sleight, PA-C   Triage Vitals: BP 136/62 mmHg  Pulse 44  Temp(Src) 98 F (36.7 C) (Oral)  Resp 16  Ht  (1.88  m)  Wt 205 lb (92.987 kg)  BMI 26.31 kg/m2  SpO2 99%  Physical Exam  Constitutional: He is oriented to person, place, and time. Vital signs are normal. He appears well-developed and well-nourished.  Non-toxic appearance. No distress.  Afebrile, nontoxic, NAD  HENT:  Head: Normocephalic and atraumatic.  Mouth/Throat: Mucous membranes are normal.  Eyes: Conjunctivae and EOM are normal. Right eye exhibits no discharge. Left eye exhibits no discharge.  Neck: Normal range of motion. Neck supple.  Cardiovascular: Intact distal pulses.  Bradycardia present.   Bradycardia is baseline for patient  Pulmonary/Chest: Effort normal. No respiratory distress.  Abdominal: Normal appearance. He exhibits no distension.  Musculoskeletal: Normal range of motion.       Left lower leg: He exhibits swelling. He exhibits no tenderness.  Left leg in a CAM walker, with mild swelling of the calf, no TTP noted, no erythema but mild warmth. ROM limited due to CAM walker.  Sensation grossly intact, distal pulses intact  Neurological: He is alert and oriented to person, place, and time. He has normal strength. No sensory deficit.  Skin: Skin is warm, dry and intact. No rash noted.  Psychiatric: He has a normal mood and affect.  Nursing note and vitals reviewed.   ED Course  Procedures (including critical care time)  DIAGNOSTIC STUDIES: Oxygen Saturation is 99% on RA, normal by my interpretation.    COORDINATION OF CARE:  5:55 PM- Will order rx for Xarelto starter pack. Advised to f/u with PCP in the next few days.  Pt advised of plan for treatment and pt agrees.   Labs Review Labs Reviewed - No data to display Results for orders placed or performed during the hospital encounter of 08/28/15  Basic metabolic panel  Result Value Ref Range   Sodium 139 135 - 145 mmol/L   Potassium 4.5 3.5 - 5.1 mmol/L   Chloride 108 101 - 111 mmol/L   CO2 25 22 - 32 mmol/L   Glucose, Bld 98 65 - 99 mg/dL   BUN 9 6 - 20  mg/dL   Creatinine, Ser 1.61 0.61 - 1.24 mg/dL   Calcium 9.0 8.9 - 09.6 mg/dL   GFR calc non Af Amer >60 >60 mL/min   GFR calc Af Amer >60 >60 mL/min   Anion gap 6 5 - 15  CBC  Result Value Ref Range   WBC 6.0 4.0 - 10.5 K/uL   RBC 4.68 4.22 - 5.81 MIL/uL   Hemoglobin 14.0 13.0 - 17.0 g/dL   HCT 04.5 40.9 - 81.1 %   MCV 89.3 78.0 - 100.0 fL   MCH 29.9 26.0 - 34.0 pg   MCHC 33.5 30.0 - 36.0 g/dL   RDW 91.4 78.2 - 95.6 %   Platelets 149 (L) 150 - 400 K/uL    Imaging Review No results found.   Progress Notes      Michelle A Simonetti at  09/25/2015 4:51 PM     Status: Signed       Expand All Collapse All   *Preliminary Results* Bilateral lower extremity venous duplex completed. The right lower extremity is negative for deep vein thrombosis. The left lower extremity is positive for deep vein thrombosis involving the left femoral, popliteal, gastrocnemius, posterior tibial, and peroneal veins.  Preliminary results discussed with Toniann Fail, who spoke with Dr. August Saucer. Per Dr. August Saucer the patient is to go to the emergency department for treatment.  09/25/2015  Gertie Fey, RVT, RDCS, RDMS         I have personally reviewed and evaluated these images as part of my medical decision-making.   EKG Interpretation None      MDM   Final diagnoses:  Leg DVT (deep venous thromboembolism), acute, left (HCC)  Left leg swelling    44 y.o. male here with +DVT on u/s, sent for treatment. Pt with swelling and warmth, no erythema noted. Recent Cr is 0.89 therefore will start on xarelto. Discussed elevation. Will have him f/up with PCP in 3-5 days. I explained the diagnosis and have given explicit precautions to return to the ER including for any other new or worsening symptoms. The patient understands and accepts the medical plan as it's been dictated and I have answered their questions. Discharge instructions concerning home care and prescriptions have been given. The patient is  STABLE and is discharged to home in good condition.   I personally performed the services described in this documentation, which was scribed in my presence. The recorded information has been reviewed and is accurate.  BP 136/62 mmHg  Pulse 44  Temp(Src) 98 F (36.7 C) (Oral)  Resp 16  Ht  (1.88 m)  Wt 205 lb (92.987 kg)  BMI 26.31 kg/m2  SpO2 99%  Meds ordered this encounter  Medications  . Rivaroxaban (XARELTO) tablet 15 mg    Sig:   . XARELTO STARTER PACK 15 & 20 MG TBPK    Sig: Take 15-20 mg by mouth as directed. Take as directed on package: Start with one  tablet by mouth twice a day with food. On Day 22, switch to one  tablet once a day with food.    Dispense:  51 each    Refill:  0    Order Specific Question:  Supervising Provider    Answer:  Eber Hong [[id number]]      Nayely Dingus Camprubi-Soms, PA-C 09/25/15 1800  Lavera Guise, MD 09/26/15 [phone]

## 2015-09-25 NOTE — Discharge Instructions (Signed)
Take xarelto as directed on the packaging. Elevate your leg to help with pain/swelling. Use tylenol/motrin as needed for pain. Follow up with your regular doctor in 3-5 days for recheck and ongoing management. Follow up with your orthopedist as instructed by them. Return to the ER for changes or worsening symptoms.   Deep Vein Thrombosis A deep vein thrombosis (DVT) is a blood clot (thrombus) that usually occurs in a deep, larger vein of the lower leg or the pelvis, or in an upper extremity such as the arm. These are dangerous and can lead to serious and even life-threatening complications if the clot travels to the lungs. A DVT can damage the valves in your leg veins so that instead of flowing upward, the blood pools in the lower leg. This is called post-thrombotic syndrome, and it can result in pain, swelling, discoloration, and sores on the leg. CAUSES A DVT is caused by the formation of a blood clot in your leg, pelvis, or arm. Usually, several things contribute to the formation of blood clots. A clot may develop when:  Your blood flow slows down.  Your vein becomes damaged in some way.  You have a condition that makes your blood clot more easily. RISK FACTORS A DVT is more likely to develop in:  People who are older, especially over 40 years of age.  People who are overweight (obese).  People who sit or lie still for a long time, such as during long-distance travel (over 4 hours), bed rest, hospitalization, or during recovery from certain medical conditions like a stroke.  People who do not engage in much physical activity (sedentary lifestyle).  People who have chronic breathing disorders.  People who have a personal or family history of blood clots or blood clotting disease.  People who have peripheral vascular disease (PVD), diabetes, or some types of cancer.  People who have heart disease, especially if the person had a recent heart attack or has congestive heart  failure.  People who have neurological diseases that affect the legs (leg paresis).  People who have had a traumatic injury, such as breaking a hip or leg.  People who have recently had major or lengthy surgery, especially on the hip, knee, or abdomen.  People who have had a central line placed inside a large vein.  People who take medicines that contain the hormone estrogen. These include birth control pills and hormone replacement therapy.  Pregnancy or during childbirth or the postpartum period.  Long plane flights (over 8 hours). SIGNS AND SYMPTOMS Symptoms of a DVT can include:   Swelling of your leg or arm, especially if one side is much worse.  Warmth and redness of your leg or arm, especially if one side is much worse.  Pain in your arm or leg. If the clot is in your leg, symptoms may be more noticeable or worse when you stand or walk.  A feeling of pins and needles, if the clot is in the arm. The symptoms of a DVT that has traveled to the lungs (pulmonary embolism, PE) usually start suddenly and include:  Shortness of breath while active or at rest.  Coughing or coughing up blood or blood-tinged mucus.  Chest pain that is often worse with deep breaths.  Rapid or irregular heartbeat.  Feeling light-headed or dizzy.  Fainting.  Feeling anxious.  Sweating. There may also be pain and swelling in a leg if that is where the blood clot started. These symptoms may represent a serious problem  that is an emergency. Do not wait to see if the symptoms will go away. Get medical help right away. Call your local emergency services (911 in the [place] Do not drive yourself to the hospital. DIAGNOSIS Your health care provider will take a medical history and perform a physical exam. You may also have other tests, including:  Blood tests to assess the clotting properties of your blood.  Imaging tests, such as CT, ultrasound, MRI, X-ray, and other tests to see if you have clots  anywhere in your body. TREATMENT After a DVT is identified, it can be treated. The type of treatment that you receive depends on many factors, such as the cause of your DVT, your risk for bleeding or developing more clots, and other medical conditions that you have. Sometimes, a combination of treatments is necessary. Treatment options may be combined and include:  Monitoring the blood clot with ultrasound.  Taking medicines by mouth, such as newer blood thinners (anticoagulants), thrombolytics, or warfarin.  Taking anticoagulant medicine by injection or through an IV tube.  Wearing compression stockings or using different types ofdevices.  Surgery (rare) to remove the blood clot or to place a filter in your abdomen to stop the blood clot from traveling to your lungs. Treatments for a DVT are often divided into immediate treatment and long-term treatment (up to 3 months after DVT). You can work with your health care provider to choose the treatment program that is best for you. HOME CARE INSTRUCTIONS If you are taking a newer oral anticoagulant:  Take the medicine every single day at the same time each day.  Understand what foods and drugs interact with this medicine.  Understand that there are no regular blood tests required when using this medicine.  Understand the side effects of this medicine, including excessive bruising or bleeding. Ask your health care provider or pharmacist about other possible side effects. If you are taking warfarin:  Understand how to take warfarin and know which foods can affect how warfarin works in Public relations account executiveyour body.  Understand that it is dangerous to take too much or too little warfarin. Too much warfarin increases the risk of bleeding. Too little warfarin continues to allow the risk for blood clots.  Follow your PT and INR blood testing schedule. The PT and INR results allow your health care provider to adjust your dose of warfarin. It is very important that  you have your PT and INR tested as often as told by your health care provider.  Avoid major changes in your diet, or tell your health care provider before you change your diet. Arrange a visit with a registered dietitian to answer your questions. Many foods, especially foods that are high in vitamin K, can interfere with warfarin and affect the PT and INR results. Eat a consistent amount of foods that are high in vitamin K, such as:  Spinach, kale, broccoli, cabbage, collard greens, turnip greens, Brussels sprouts, peas, cauliflower, seaweed, and parsley.  Beef liver and pork liver.  Green tea.  Soybean oil.  Tell your health care provider about any and all medicines, vitamins, and supplements that you take, including aspirin and other over-the-counter anti-inflammatory medicines. Be especially cautious with aspirin and anti-inflammatory medicines. Do not take those before you ask your health care provider if it is safe to do so. This is important because many medicines can interfere with warfarin and affect the PT and INR results.  Do not start or stop taking any over-the-counter or prescription medicine  unless your health care provider or pharmacist tells you to do so. If you take warfarin, you will also need to do these things:  Hold pressure over cuts for longer than usual.  Tell your dentist and other health care providers that you are taking warfarin before you have any procedures in which bleeding may occur.  Avoid alcohol or drink very small amounts. Tell your health care provider if you change your alcohol intake.  Do not use tobacco products, including cigarettes, chewing tobacco, and e-cigarettes. If you need help quitting, ask your health care provider.  Avoid contact sports. General Instructions  Take over-the-counter and prescription medicines only as told by your health care provider. Anticoagulant medicines can have side effects, including easy bruising and difficulty  stopping bleeding. If you are prescribed an anticoagulant, you will also need to do these things:  Hold pressure over cuts for longer than usual.  Tell your dentist and other health care providers that you are taking anticoagulants before you have any procedures in which bleeding may occur.  Avoid contact sports.  Wear a medical alert bracelet or carry a medical alert card that says you have had a PE.  Ask your health care provider how soon you can go back to your normal activities. Stay active to prevent new blood clots from forming.  Make sure to exercise while traveling or when you have been sitting or standing for a long period of time. It is very important to exercise. Exercise your legs by walking or by tightening and relaxing your leg muscles often. Take frequent walks.  Wear compression stockings as told by your health care provider to help prevent more blood clots from forming.  Do not use tobacco products, including cigarettes, chewing tobacco, and e-cigarettes. If you need help quitting, ask your health care provider.  Keep all follow-up appointments with your health care provider. This is important. PREVENTION Take these actions to decrease your risk of developing another DVT:  Exercise regularly. For at least 30 minutes every day, engage in:  Activity that involves moving your arms and legs.  Activity that encourages good blood flow through your body by increasing your heart rate.  Exercise your arms and legs every hour during long-distance travel (over 4 hours). Drink plenty of water and avoid drinking alcohol while traveling.  Avoid sitting or lying in bed for long periods of time without moving your legs.  Maintain a weight that is appropriate for your height. Ask your health care provider what weight is healthy for you.  If you are a woman who is over 30 years of age, avoid unnecessary use of medicines that contain estrogen. These include birth control pills.  Do  not smoke, especially if you take estrogen medicines. If you need help quitting, ask your health care provider. If you are hospitalized, prevention measures may include:  Early walking after surgery, as soon as your health care provider says that it is safe.  Receiving anticoagulants to prevent blood clots.If you cannot take anticoagulants, other options may be available, such as wearing compression stockings or using different types of devices. SEEK IMMEDIATE MEDICAL CARE IF:  You have new or increased pain, swelling, or redness in an arm or leg.  You have numbness or tingling in an arm or leg.  You have shortness of breath while active or at rest.  You have chest pain.  You have a rapid or irregular heartbeat.  You feel light-headed or dizzy.  You cough up blood.  You notice blood in your vomit, bowel movement, or urine. These symptoms may represent a serious problem that is an emergency. Do not wait to see if the symptoms will go away. Get medical help right away. Call your local emergency services (911 in the [place] Do not drive yourself to the hospital.   This information is not intended to replace advice given to you by your health care provider. Make sure you discuss any questions you have with your health care provider.   Document Released: 11/11/2005 Document Revised: 08/02/2015 Document Reviewed: 03/08/2015 Elsevier Interactive Patient Education 2016 [employer]  Venous Thromboembolism Venous thromboembolism (VTE) is a condition in which a blood clot (thrombus) develops in the body. A thrombus usually occurs in a deep vein in the leg or the pelvis, but it can also occur in the arm. Sometimes, pieces of a thrombus can break off from its original place of development and travel through the bloodstream to other parts of the body. When that happens, the thrombus is called an embolism. An embolism can block the blood flow in the blood vessels of other organs. There are two  serious types of VTE:  Deep vein thrombosis (DVT). A DVT is a thrombus that usually occurs in a deep, larger vein of the lower leg or the pelvis, or in an upper extremity such as the arm.  Pulmonary embolism (PE). A PE occurs when an embolism has formed and traveled to the lungs. A PE can block or decrease the blood flow in one lung or both lungs. VTE is a serious health condition that can cause disability or death. It is very important to get help right away and to not ignore symptoms. CAUSES VTE is caused by the formation of a blood clot in your leg, pelvis, or arm. Usually, several things contribute to the formation of blood clots. A clot may develop when:  Your blood flow slows down.  Your vein becomes damaged in some way.  You have a condition that makes your blood clot more easily. RISK FACTORS A VTE is more likely to develop in:  People who are older, especially over 93 years of age.  People who are overweight (obese).  People who sit or lie still for a long time, such as during long-distance travel (over 4 hours), bed rest, hospitalization, or during recovery from certain medical conditions like a stroke.  People who do not engage in much physical activity (sedentary lifestyle).  People who have chronic breathing disorders.  People who have a personal or family history of blood clots or blood clotting disease.  People who have peripheral vascular disease (PVD), diabetes, or some types of cancer.  People who have heart disease, especially if the person had a recent heart attack or has congestive heart failure.  People who have neurological diseases that affect the legs (leg paresis).  People who have had a traumatic injury, such as breaking a hip or leg.  People who have recently had major or lengthy surgery, especially on the hip, knee, or abdomen.  People who have had a central line placed inside a large vein.  People who take medicines that contain the hormone  estrogen. These include birth control pills and hormone replacement therapy.  Pregnancy or during childbirth or the postpartum period.  Long plane flights (over 8 hours). SIGNS AND SYMPTOMS  Symptoms of VTE can depend on where the clot is located and whether the clot breaks off and travels to another organ. Sometimes, there may be  no symptoms. Symptoms of a DVT can include:  Swelling of your leg or arm, especially if one side is much worse.  Warmth and redness of your leg or arm, especially if one side is much worse.  Pain in your arm or leg. If the clot is in your leg, symptoms may be more noticeable or worse when you stand or walk.  A feeling of pins and needles if the clot is in the arm. The symptoms of a PE usually start suddenly and include:  Shortness of breath while active or at rest.  Coughing or coughing up blood or blood-tinged mucus.  Chest pain that is often worse with deep breaths.  Rapid or irregular heartbeat.  Feeling light-headed or dizzy.  Fainting.  Feeling anxious.  Sweating. There may also be pain and swelling in a leg if that is where the blood clot started. These symptoms may represent a serious problem that is an emergency. Do not wait to see if the symptoms will go away. Get medical help right away. Call your local emergency services (911 in the [place] Do not drive yourself to the hospital. DIAGNOSIS Your health care provider will take a medical history and perform a physical exam. You may also have other tests, including:  Blood tests to assess the clotting properties of your blood.  Imaging tests, such as CT, ultrasound, MRI, X-ray, and other tests to see if you have clots anywhere in your body.  An electrocardiogram (ECG) to look for heart strain from blood clots in the lungs.  An echocardiogram. TREATMENT After a VTE is identified, it can be treated. The main goals of treatment are:  To stop a blood clot from growing larger.  To stop new  blood clots from forming.  To stop a blood clot from traveling to the lungs (pulmonary embolism). The type of treatment that you receive depends on many factors, such as the cause of your VTE, your risk for bleeding or developing more clots, and other medical conditions that you have. Sometimes, a combination of treatments is necessary. Treatment options may be combined and include:  Monitoring the blood clot with ultrasound.  Taking medicines by mouth, such as newer blood thinners (anticoagulants), thrombolytics, or warfarin.  Taking anticoagulant medicine by injection or through an IV tube.  Wearingcompression stockings or using different types of devices.  Surgery (rare) to remove the blood clot or to place a filter in your abdomen to stop the blood clot from traveling to your lungs. Treatments for VTE are often divided into immediate treatment and long-term treatment (up to 3 months after VTE). You can work with your health care provider to choose the treatment program that is best for you. HOME CARE INSTRUCTIONS If you are taking a newer oral anticoagulant:  Take the medicine every single day at the same time each day.  Understand what foods and drugs interact with this medicine.  Understand that there are no regular blood tests required when using this medicine.  Understand the side effects of this medicine, including excessive bruising or bleeding. Ask your health care provider or pharmacist about other possible side effects. If you are taking warfarin:  Understand how to take warfarin and know which foods can affect how warfarin works in Public relations account executive.  Understand that it is dangerous to take too much or too little warfarin. Too much warfarin increases the risk of bleeding. Too little warfarin continues to allow the risk for blood clots.  Follow your PT and INR blood  testing schedule. The PT and INR results allow your health care provider to adjust your dose of warfarin. It is  very important that you have your PT and INR tested as often as told by your health care provider.  Avoid major changes in your diet, or tell your health care provider before you change your diet. Arrange a visit with a registered dietitian to answer your questions. Many foods, especially foods that are high in vitamin K, can interfere with warfarin and affect the PT and INR results. Eat a consistent amount of foods that are high in vitamin K, such as:  Spinach, kale, broccoli, cabbage, collard greens, turnip greens, Brussels sprouts, peas, cauliflower, seaweed, and parsley.  Beef liver and pork liver.  Green tea.  Soybean oil.  Tell your health care provider about any and all medicines, vitamins, and supplements that you take, including aspirin and other over-the-counter anti-inflammatory medicines. Be especially cautious with aspirin and anti-inflammatory medicines. Do not take those before you ask your health care provider if it is safe to do so. This is important because many medicines can interfere with warfarin and affect the PT and INR results.  Do not start or stop taking any over-the-counter or prescription medicine unless your health care provider or pharmacist tells you to do so. If you take warfarin, you will also need to do these things:  Hold pressure over cuts for longer than usual.  Tell your dentist and other health care providers that you are taking warfarin before you have any procedures in which bleeding may occur.  Avoid alcohol or drink very small amounts. Tell your health care provider if you change your alcohol intake.  Do not use tobacco products, including cigarettes, chewing tobacco, and e-cigarettes. If you need help quitting, ask your health care provider.  Avoid contact sports. General Instructions  Take over-the-counter and prescription medicines only as told by your health care provider. Anticoagulant medicines can have side effects, including easy bruising  and difficulty stopping bleeding. If you are prescribed an anticoagulant, you will also need to do these things:  Hold pressure over cuts for longer than usual.  Tell your dentist and other health care providers that you are taking anticoagulants before you have any procedures in which bleeding may occur.  Avoid contact sports.  Wear a medical alert bracelet or carry a medical alert card that says you have had a PE.  Ask your health care provider how soon you can go back to your normal activities. Stay active to prevent new blood clots from forming.  Make sure to exercise while traveling or when you have been sitting or standing for a long period of time. It is very important to exercise. Exercise your legs by walking or by tightening and relaxing your leg muscles often. Take frequent walks.  Wear compression stockings as told by your health care provider to help prevent more blood clots from forming.  Do not use tobacco products, including cigarettes, chewing tobacco, and e-cigarettes. If you need help quitting, ask your health care provider.  Keep all follow-up appointments with your health care provider. This is important. PREVENTION Take these actions to decrease your risk of developing another VTE:  Exercise regularly. For at least 30 minutes every day, engage in:  Activity that involves moving your arms and legs.  Activity that encourages good blood flow through your body by increasing your heart rate.  Exercise your arms and legs every hour during long-distance travel (over 4 hours). Drink plenty  of water and avoid drinking alcohol while traveling.  Avoid sitting or lying in bed for long periods of time without moving your legs.  Maintain a weight that is appropriate for your height. Ask your health care provider what weight is healthy for you.  If you are a woman who is over 45 years of age, avoid unnecessary use of medicines that contain estrogen. These include birth  control pills.  Do not smoke, especially if you take estrogen medicines. If you need help quitting, ask your health care provider. If you are hospitalized, prevention measures may include:  Early walking after surgery, as soon as your health care provider says that it is safe.  Receiving anticoagulants to prevent blood clots.If you cannot take anticoagulants, other options may be available, such as wearing compression stockings or using different types of devices. SEEK IMMEDIATE MEDICAL CARE IF:  You have new or increased pain, swelling, or redness in an arm or leg.  You have numbness or tingling in an arm or leg.  You have shortness of breath while active or at rest.  You have chest pain.  You have a rapid or irregular heartbeat.  You feel light-headed or dizzy.  You cough up blood.  You notice blood in your vomit, bowel movement, or urine. These symptoms may represent a serious problem that is an emergency. Do not wait to see if the symptoms will go away. Get medical help right away. Call your local emergency services (911 in the [place] Do not drive yourself to the hospital.   This information is not intended to replace advice given to you by your health care provider. Make sure you discuss any questions you have with your health care provider.   Document Released: 09/08/2009 Document Revised: 08/02/2015 Document Reviewed: 03/08/2015 Elsevier Interactive Patient Education [employer]

## 2015-09-25 NOTE — Progress Notes (Signed)
*  Preliminary Results* Bilateral lower extremity venous duplex completed. The right lower extremity is negative for deep vein thrombosis. The left lower extremity is positive for deep vein thrombosis involving the left femoral, popliteal, gastrocnemius, posterior tibial, and peroneal veins.  Preliminary results discussed with Toniann FailWendy, who spoke with Dr. August Saucerean. Per Dr. August Saucerean the patient is to go to the emergency department for treatment.  09/25/2015  Gertie FeyMichelle Jorita Bohanon, RVT, RDCS, RDMS

## 2015-09-26 ENCOUNTER — Encounter (HOSPITAL_COMMUNITY): Payer: Self-pay | Admitting: *Deleted

## 2015-09-26 ENCOUNTER — Emergency Department (HOSPITAL_COMMUNITY)
Admission: EM | Admit: 2015-09-26 | Discharge: 2015-09-26 | Disposition: A | Discharge status: Home / Self Care | Payer: 59 | Source: Home / Self Care | Attending: Emergency Medicine | Admitting: Emergency Medicine

## 2015-09-26 DIAGNOSIS — I82412 Acute embolism and thrombosis of left femoral vein: Secondary | ICD-10-CM

## 2015-09-26 MED ORDER — RIVAROXABAN 15 MG PO TABS
15.0000 mg | ORAL_TABLET | Freq: Once | ORAL | Status: AC
  Administered 2015-09-26: 15 mg via ORAL
  Filled 2015-09-26 (×2): qty 1

## 2015-09-26 NOTE — ED Notes (Signed)
PT achilles tendon surgery this month and then started having swelling and pain to calf area.  Pt was diagnosed with blood clots times to left leg and one in thigh.  Sent here for blood thinners and cannot afford Xarelto.

## 2015-09-26 NOTE — Discharge Instructions (Signed)
Deep Vein Thrombosis °A deep vein thrombosis (DVT) is a blood clot (thrombus) that usually occurs in a deep, larger vein of the lower leg or the pelvis, or in an upper extremity such as the arm. These are dangerous and can lead to serious and even life-threatening complications if the clot travels to the lungs. °A DVT can damage the valves in your leg veins so that instead of flowing upward, the blood pools in the lower leg. This is called post-thrombotic syndrome, and it can result in pain, swelling, discoloration, and sores on the leg. °CAUSES °A DVT is caused by the formation of a blood clot in your leg, pelvis, or arm. Usually, several things contribute to the formation of blood clots. A clot may develop when: °· Your blood flow slows down. °· Your vein becomes damaged in some way. °· You have a condition that makes your blood clot more easily. °RISK FACTORS °A DVT is more likely to develop in: °· People who are older, especially over 60 years of age. °· People who are overweight (obese). °· People who sit or lie still for a long time, such as during long-distance travel (over 4 hours), bed rest, hospitalization, or during recovery from certain medical conditions like a stroke. °· People who do not engage in much physical activity (sedentary lifestyle). °· People who have chronic breathing disorders. °· People who have a personal or family history of blood clots or blood clotting disease. °· People who have peripheral vascular disease (PVD), diabetes, or some types of cancer. °· People who have heart disease, especially if the person had a recent heart attack or has congestive heart failure. °· People who have neurological diseases that affect the legs (leg paresis). °· People who have had a traumatic injury, such as breaking a hip or leg. °· People who have recently had major or lengthy surgery, especially on the hip, knee, or abdomen. °· People who have had a central line placed inside a large vein. °· People  who take medicines that contain the hormone estrogen. These include birth control pills and hormone replacement therapy. °· Pregnancy or during childbirth or the postpartum period. °· Long plane flights (over 8 hours). °SIGNS AND SYMPTOMS °Symptoms of a DVT can include:  °· Swelling of your leg or arm, especially if one side is much worse. °· Warmth and redness of your leg or arm, especially if one side is much worse. °· Pain in your arm or leg. If the clot is in your leg, symptoms may be more noticeable or worse when you stand or walk. °· A feeling of pins and needles, if the clot is in the arm. °The symptoms of a DVT that has traveled to the lungs (pulmonary embolism, PE) usually start suddenly and include: °· Shortness of breath while active or at rest. °· Coughing or coughing up blood or blood-tinged mucus. °· Chest pain that is often worse with deep breaths. °· Rapid or irregular heartbeat. °· Feeling light-headed or dizzy. °· Fainting. °· Feeling anxious. °· Sweating. °There may also be pain and swelling in a leg if that is where the blood clot started. °These symptoms may represent a serious problem that is an emergency. Do not wait to see if the symptoms will go away. Get medical help right away. Call your local emergency services (911 in the [place] Do not drive yourself to the hospital. °DIAGNOSIS °Your health care provider will take a medical history and perform a physical exam. You may also   have other tests, including: °· Blood tests to assess the clotting properties of your blood. °· Imaging tests, such as CT, ultrasound, MRI, X-ray, and other tests to see if you have clots anywhere in your body. °TREATMENT °After a DVT is identified, it can be treated. The type of treatment that you receive depends on many factors, such as the cause of your DVT, your risk for bleeding or developing more clots, and other medical conditions that you have. Sometimes, a combination of treatments is necessary. Treatment  options may be combined and include: °· Monitoring the blood clot with ultrasound. °· Taking medicines by mouth, such as newer blood thinners (anticoagulants), thrombolytics, or warfarin. °· Taking anticoagulant medicine by injection or through an IV tube. °· Wearing compression stockings or using different types of devices. °· Surgery (rare) to remove the blood clot or to place a filter in your abdomen to stop the blood clot from traveling to your lungs. °Treatments for a DVT are often divided into immediate treatment and long-term treatment (up to 3 months after DVT). You can work with your health care provider to choose the treatment program that is best for you. °HOME CARE INSTRUCTIONS °If you are taking a newer oral anticoagulant: °· Take the medicine every single day at the same time each day. °· Understand what foods and drugs interact with this medicine. °· Understand that there are no regular blood tests required when using this medicine. °· Understand the side effects of this medicine, including excessive bruising or bleeding. Ask your health care provider or pharmacist about other possible side effects. °If you are taking warfarin: °· Understand how to take warfarin and know which foods can affect how warfarin works in your body. °· Understand that it is dangerous to take too much or too little warfarin. Too much warfarin increases the risk of bleeding. Too little warfarin continues to allow the risk for blood clots. °· Follow your PT and INR blood testing schedule. The PT and INR results allow your health care provider to adjust your dose of warfarin. It is very important that you have your PT and INR tested as often as told by your health care provider. °· Avoid major changes in your diet, or tell your health care provider before you change your diet. Arrange a visit with a registered dietitian to answer your questions. Many foods, especially foods that are high in vitamin K, can interfere with warfarin  and affect the PT and INR results. Eat a consistent amount of foods that are high in vitamin K, such as: °¨ Spinach, kale, broccoli, cabbage, collard greens, turnip greens, Brussels sprouts, peas, cauliflower, seaweed, and parsley. °¨ Beef liver and pork liver. °¨ Green tea. °¨ Soybean oil. °· Tell your health care provider about any and all medicines, vitamins, and supplements that you take, including aspirin and other over-the-counter anti-inflammatory medicines. Be especially cautious with aspirin and anti-inflammatory medicines. Do not take those before you ask your health care provider if it is safe to do so. This is important because many medicines can interfere with warfarin and affect the PT and INR results. °· Do not start or stop taking any over-the-counter or prescription medicine unless your health care provider or pharmacist tells you to do so. °If you take warfarin, you will also need to do these things: °· Hold pressure over cuts for longer than usual. °· Tell your dentist and other health care providers that you are taking warfarin before you have any procedures in which   bleeding may occur. °· Avoid alcohol or drink very small amounts. Tell your health care provider if you change your alcohol intake. °· Do not use tobacco products, including cigarettes, chewing tobacco, and e-cigarettes. If you need help quitting, ask your health care provider. °· Avoid contact sports. °General Instructions °· Take over-the-counter and prescription medicines only as told by your health care provider. Anticoagulant medicines can have side effects, including easy bruising and difficulty stopping bleeding. If you are prescribed an anticoagulant, you will also need to do these things: °¨ Hold pressure over cuts for longer than usual. °¨ Tell your dentist and other health care providers that you are taking anticoagulants before you have any procedures in which bleeding may occur. °¨ Avoid contact sports. °· Wear a medical  alert bracelet or carry a medical alert card that says you have had a PE. °· Ask your health care provider how soon you can go back to your normal activities. Stay active to prevent new blood clots from forming. °· Make sure to exercise while traveling or when you have been sitting or standing for a long period of time. It is very important to exercise. Exercise your legs by walking or by tightening and relaxing your leg muscles often. Take frequent walks. °· Wear compression stockings as told by your health care provider to help prevent more blood clots from forming. °· Do not use tobacco products, including cigarettes, chewing tobacco, and e-cigarettes. If you need help quitting, ask your health care provider. °· Keep all follow-up appointments with your health care provider. This is important. °PREVENTION °Take these actions to decrease your risk of developing another DVT: °· Exercise regularly. For at least 30 minutes every day, engage in: °¨ Activity that involves moving your arms and legs. °¨ Activity that encourages good blood flow through your body by increasing your heart rate. °· Exercise your arms and legs every hour during long-distance travel (over 4 hours). Drink plenty of water and avoid drinking alcohol while traveling. °· Avoid sitting or lying in bed for long periods of time without moving your legs. °· Maintain a weight that is appropriate for your height. Ask your health care provider what weight is healthy for you. °· If you are a woman who is over 35 years of age, avoid unnecessary use of medicines that contain estrogen. These include birth control pills. °· Do not smoke, especially if you take estrogen medicines. If you need help quitting, ask your health care provider. °If you are hospitalized, prevention measures may include: °· Early walking after surgery, as soon as your health care provider says that it is safe. °· Receiving anticoagulants to prevent blood clots. If you cannot take  anticoagulants, other options may be available, such as wearing compression stockings or using different types of devices. °SEEK IMMEDIATE MEDICAL CARE IF: °· You have new or increased pain, swelling, or redness in an arm or leg. °· You have numbness or tingling in an arm or leg. °· You have shortness of breath while active or at rest. °· You have chest pain. °· You have a rapid or irregular heartbeat. °· You feel light-headed or dizzy. °· You cough up blood. °· You notice blood in your vomit, bowel movement, or urine. °These symptoms may represent a serious problem that is an emergency. Do not wait to see if the symptoms will go away. Get medical help right away. Call your local emergency services (911 in the [place] Do not drive yourself to the hospital. °  °  This information is not intended to replace advice given to you by your health care provider. Make sure you discuss any questions you have with your health care provider. °  °Document Released: 11/11/2005 Document Revised: 08/02/2015 Document Reviewed: 03/08/2015 °Elsevier Interactive Patient Education ©2016 [employer] ° °

## 2015-09-26 NOTE — ED Notes (Signed)
Pt is in stable condition upon d/c and ambulates from ED. 

## 2015-09-26 NOTE — ED Provider Notes (Signed)
CSN: [id number]     Arrival date & time 09/26/15  1244 History   First MD Initiated Contact with Patient 09/26/15 1603     Chief Complaint  Patient presents with  . Leg Pain     (Consider location/radiation/quality/duration/timing/severity/associated sxs/prior Treatment) HPI Patient was seen yesterday in the emergency department for nearly diagnosed left lower extremity DVT. Patient with Achilles tendon rupture and repair early October. States he's had 6 days of left lower sugary swelling and calf tightness. DVT showed left lower extremity DVT in the left femoral, popliteal, gastrocnemius, posterior tibial and peroneal veins. Was evaluated in the emergency department and given Xarelto starter prescription. Patient states the prescription cost close to $600 out of pocket. States he cannot afford this at this time. He denies any new symptoms. No new leg swelling. No chest pain. No shortness of breath. Past Medical History  Diagnosis Date  . Herpes genitalis in men   . Bilateral optic neuritis     on MRI  . Lyme disease 08/2007  . Tinea corporis   . Eczema   . Rhinitis   . Hyperlipidemia   . Substance abuse   . Alcoholism [facility])    Past Surgical History  Procedure Laterality Date  . Tonsillectomy    . Achilles tendon surgery Left 08/28/2015    Procedure: ACHILLES TENDON REPAIR;  Surgeon: Eldred Manges, MD;  Location: [facility] OR;  Service: Orthopedics;  Laterality: Left;   Family History  Problem Relation Age of Onset  . Lung cancer Mother 60    and brain cancer  . Diabetes Mother   . Heart disease Father 63    arrhythmia  . Hypertension Father   . Rectal cancer Sister 62  . Diabetes Sister   . Hypertension Brother   . Diabetes Brother    Social History  Substance Use Topics  . Smoking status: Former Smoker -- 14 years    Types: Cigarettes  . Smokeless tobacco: Never Used     Comment: quit 2002  . Alcohol Use: No     Comment: 08/25/15 - sorber 60 days    Review of Systems   Constitutional: Negative for fever and chills.  Respiratory: Negative for cough and shortness of breath.   Cardiovascular: Positive for leg swelling. Negative for chest pain.  Gastrointestinal: Negative for nausea, vomiting, abdominal pain, diarrhea and constipation.  Musculoskeletal: Negative for back pain, neck pain and neck stiffness.  Skin: Negative for rash and wound.  Neurological: Negative for dizziness, weakness, light-headedness, numbness and headaches.  All other systems reviewed and are negative.     Allergies  Review of patient's allergies indicates no known allergies.  Home Medications   Prior to Admission medications   Medication Sig Start Date End Date Taking? Authorizing Provider  cetirizine (ZYRTEC) 10 MG tablet Take 10 mg by mouth daily as needed for allergies.   Yes Historical Provider, MD  Cholecalciferol (VITAMIN D PO) Take 1 capsule by mouth 2 (two) times daily.   Yes Historical Provider, MD  Omega-3 Fatty Acids (FISH OIL) 1200 MG CAPS Take 1 capsule by mouth 2 (two) times daily.   Yes Historical Provider, MD  oxyCODONE-acetaminophen (ROXICET) 5-325 MG tablet Take 1 tablet by mouth every 4 (four) hours as needed for severe pain. Patient not taking: Reported on 09/26/2015 08/28/15   Naida Sleight, PA-C  XARELTO STARTER PACK 15 & 20 MG TBPK Take 15-20 mg by mouth as directed. Take as directed on package: Start with one  tablet  by mouth twice a day with food. On Day 22, switch to one 20mg  tablet once a day with food. Patient not taking: Reported on 09/26/2015 09/25/15   Mercedes Camprubi-Soms, PA-C   BP 108/2 mmHg  Pulse 49  Temp(Src) 98.2 F (36.8 C) (Oral)  Resp 18  SpO2 100% Physical Exam  Constitutional: He is oriented to person, place, and time. He appears well-developed and well-nourished. No distress.  HENT:  Head: Normocephalic and atraumatic.  Mouth/Throat: Oropharynx is clear and moist.  Eyes: EOM are normal. Pupils are equal, round, and reactive  to light.  Neck: Normal range of motion. Neck supple.  Cardiovascular: Normal rate and regular rhythm.  Exam reveals no gallop and no friction rub.   No murmur heard. Pulmonary/Chest: Effort normal and breath sounds normal. No respiratory distress. He has no wheezes. He has no rales. He exhibits no tenderness.  Abdominal: Soft. Bowel sounds are normal.  Musculoskeletal: Normal range of motion. He exhibits edema. He exhibits no tenderness.  Left lower extremity swelling compared to right. Left-sided calf tightness. Distal pulses intact.  Neurological: He is alert and oriented to person, place, and time.  Skin: Skin is warm and dry. No rash noted. No erythema.  Psychiatric: He has a normal mood and affect. His behavior is normal.  Nursing note and vitals reviewed.   ED Course  Procedures (including critical care time) Labs Review Labs Reviewed - No data to display  Imaging Review No results found. I have personally reviewed and evaluated these images and lab results as part of my medical decision-making.   EKG Interpretation None      MDM   Final diagnoses:  Acute deep vein thrombosis (DVT) of femoral vein of left lower extremity (HCC)    Seen by case management. Given coupon for this medication. Also updated patient insurance which will cover the Xarelto.  Dose of Xarelto given in the emergency department. Patient understands he needs to return for shortness of breath, chest pain uncontrolled bleeding or for any concerns.  Loren Raceravid Damaya Channing, MD 09/26/15 1725

## 2015-09-26 NOTE — ED Notes (Signed)
Spoke with Burna MortimerWanda from Case Management, she will come and try to set up help with payment for xarelto.

## 2015-10-18 ENCOUNTER — Emergency Department (HOSPITAL_COMMUNITY): Payer: 59

## 2015-10-18 ENCOUNTER — Encounter (HOSPITAL_COMMUNITY): Payer: Self-pay | Admitting: Emergency Medicine

## 2015-10-18 ENCOUNTER — Emergency Department (HOSPITAL_COMMUNITY)
Admission: EM | Admit: 2015-10-18 | Discharge: 2015-10-18 | Disposition: A | Discharge status: Home / Self Care | Payer: 59 | Attending: Emergency Medicine | Admitting: Emergency Medicine

## 2015-10-18 ENCOUNTER — Telehealth: Payer: Self-pay | Admitting: Internal Medicine

## 2015-10-18 DIAGNOSIS — Z8669 Personal history of other diseases of the nervous system and sense organs: Secondary | ICD-10-CM | POA: Diagnosis not present

## 2015-10-18 DIAGNOSIS — Z8619 Personal history of other infectious and parasitic diseases: Secondary | ICD-10-CM | POA: Diagnosis not present

## 2015-10-18 DIAGNOSIS — Z79899 Other long term (current) drug therapy: Secondary | ICD-10-CM | POA: Diagnosis not present

## 2015-10-18 DIAGNOSIS — E785 Hyperlipidemia, unspecified: Secondary | ICD-10-CM | POA: Insufficient documentation

## 2015-10-18 DIAGNOSIS — Z872 Personal history of diseases of the skin and subcutaneous tissue: Secondary | ICD-10-CM | POA: Insufficient documentation

## 2015-10-18 DIAGNOSIS — R079 Chest pain, unspecified: Secondary | ICD-10-CM | POA: Diagnosis present

## 2015-10-18 DIAGNOSIS — Z87891 Personal history of nicotine dependence: Secondary | ICD-10-CM | POA: Diagnosis not present

## 2015-10-18 DIAGNOSIS — R0789 Other chest pain: Secondary | ICD-10-CM | POA: Insufficient documentation

## 2015-10-18 LAB — BASIC METABOLIC PANEL
ANION GAP: 7 (ref 5–15)
BUN: 15 mg/dL (ref 6–20)
CHLORIDE: 103 mmol/L (ref 101–111)
CO2: 27 mmol/L (ref 22–32)
Calcium: 9.2 mg/dL (ref 8.9–10.3)
Creatinine, Ser: 1.07 mg/dL (ref 0.61–1.24)
Glucose, Bld: 102 mg/dL — ABNORMAL HIGH (ref 65–99)
POTASSIUM: 4 mmol/L (ref 3.5–5.1)
SODIUM: 137 mmol/L (ref 135–145)

## 2015-10-18 LAB — CBC
HEMATOCRIT: 42.7 % (ref 39.0–52.0)
Hemoglobin: 14.3 g/dL (ref 13.0–17.0)
MCH: 29.7 pg (ref 26.0–34.0)
MCHC: 33.5 g/dL (ref 30.0–36.0)
MCV: 88.8 fL (ref 78.0–100.0)
Platelets: 176 10*3/uL (ref 150–400)
RBC: 4.81 MIL/uL (ref 4.22–5.81)
RDW: 13.7 % (ref 11.5–15.5)
WBC: 6.1 10*3/uL (ref 4.0–10.5)

## 2015-10-18 LAB — I-STAT CHEM 8, ED
BUN: 15 mg/dL (ref 6–20)
CHLORIDE: 101 mmol/L (ref 101–111)
Calcium, Ion: 1.18 mmol/L (ref 1.12–1.23)
Creatinine, Ser: 1.1 mg/dL (ref 0.61–1.24)
Glucose, Bld: 97 mg/dL (ref 65–99)
HEMATOCRIT: 47 % (ref 39.0–52.0)
Hemoglobin: 16 g/dL (ref 13.0–17.0)
Potassium: 4 mmol/L (ref 3.5–5.1)
SODIUM: 139 mmol/L (ref 135–145)
TCO2: 25 mmol/L (ref 0–100)

## 2015-10-18 LAB — I-STAT TROPONIN, ED: Troponin i, poc: 0.01 ng/mL (ref 0.00–0.08)

## 2015-10-18 MED ORDER — GI COCKTAIL ~~LOC~~
30.0000 mL | Freq: Once | ORAL | Status: AC
Start: 2015-10-18 — End: 2015-10-18
  Administered 2015-10-18: 30 mL via ORAL
  Filled 2015-10-18: qty 30

## 2015-10-18 MED ORDER — RANITIDINE HCL 150 MG PO CAPS: 150.0000 mg | ORAL_CAPSULE | Freq: Every day | ORAL | Status: AC

## 2015-10-18 MED ORDER — SODIUM CHLORIDE 0.9 % IV BOLUS (SEPSIS)
1000.0000 mL | Freq: Once | INTRAVENOUS | Status: AC
  Administered 2015-10-18: 1000 mL via INTRAVENOUS

## 2015-10-18 MED ORDER — IOHEXOL 350 MG/ML SOLN
100.0000 mL | Freq: Once | INTRAVENOUS | Status: AC | PRN
  Administered 2015-10-18: 100 mL via INTRAVENOUS

## 2015-10-18 MED ORDER — OMEPRAZOLE 20 MG PO CPDR: 20.0000 mg | DELAYED_RELEASE_CAPSULE | Freq: Two times a day (BID) | ORAL | Status: AC

## 2015-10-18 NOTE — ED Notes (Signed)
PA at bedside.

## 2015-10-18 NOTE — ED Notes (Signed)
Patient transported to X-ray 

## 2015-10-18 NOTE — Telephone Encounter (Signed)
Patient Name: Timothy MillardBARRETT Hohman  DOB: 1971-10-03    Initial Comment caller is having chest tightness and pressure and is short of breath - has a hx of blood clots   Nurse Assessment  Nurse: Renaldo FiddlerAdkins, RN, Raynelle FanningJulie Date/Time Lamount Cohen(Eastern Time): 10/18/2015 8:46:04 AM  Confirm and document reason for call. If symptomatic, describe symptoms. ---Caller states he is having chest pressure, tightness and he feels sob. He was dx with DVT 3 weeks ago, post Achilles Tendon Repair and is taking Xarelto. Confirms his sx began last night and are worse this morning.  Has the patient traveled out of the country within the last 30 days? ---Not Applicable  Does the patient have any new or worsening symptoms? ---Yes  Will a triage be completed? ---Yes  Related visit to physician within the last 2 weeks? ---No  Does the PT have any chronic conditions? (i.e. diabetes, asthma, etc.) ---Yes  List chronic conditions. ---DVT , Achilles Tendon Repair  Is this a behavioral health call? ---No     Guidelines    Guideline Title Affirmed Question Affirmed Notes  Chest Pain [1] Chest pain lasts > 5 minutes AND [2] described as crushing, pressure-like, or heavy    Final Disposition User   Call EMS 911 Now Renaldo FiddlerAdkins, RN, Raynelle FanningJulie    Referrals  [facility] - ED   Disagree/Comply: Disagree  Disagree/Comply Reason: Disagree with instructions

## 2015-10-18 NOTE — ED Notes (Signed)
Per patient, states chest pain after eating hamburger last night-states he thought it might be acid reflux-did not take any meds to relieve discomfort

## 2015-10-18 NOTE — Discharge Instructions (Signed)
Follow up with your doctor for further care.  Take prilosec and zantac 30 minutes before meal as it may help with your pain.  Return if you have any concerns.  Chest Pain Observation It is often hard to give a specific diagnosis for the cause of chest pain. Among other possibilities your symptoms might be caused by inadequate oxygen delivery to your heart (angina). Angina that is not treated or evaluated can lead to a heart attack (myocardial infarction) or death. Blood tests, electrocardiograms, and X-rays may have been done to help determine a possible cause of your chest pain. After evaluation and observation, your health care provider has determined that it is unlikely your pain was caused by an unstable condition that requires hospitalization. However, a full evaluation of your pain may need to be completed, with additional diagnostic testing as directed. It is very important to keep your follow-up appointments. Not keeping your follow-up appointments could result in permanent heart damage, disability, or death. If there is any problem keeping your follow-up appointments, you must call your health care provider. HOME CARE INSTRUCTIONS  Due to the slight chance that your pain could be angina, it is important to follow your health care provider's treatment plan and also maintain a healthy lifestyle:  Maintain or work toward achieving a healthy weight.  Stay physically active and exercise regularly.  Decrease your salt intake.  Eat a balanced, healthy diet. Talk to a dietitian to learn about heart-healthy foods.  Increase your fiber intake by including whole grains, vegetables, fruits, and nuts in your diet.  Avoid situations that cause stress, anger, or depression.  Take medicines as advised by your health care provider. Report any side effects to your health care provider. Do not stop medicines or adjust the dosages on your own.  Quit smoking. Do not use nicotine patches or gum until you  check with your health care provider.  Keep your blood pressure, blood sugar, and cholesterol levels within normal limits.  Limit alcohol intake to no more than 1 drink per day for women who are not pregnant and 2 drinks per day for men.  Do not abuse drugs. SEEK IMMEDIATE MEDICAL CARE IF: You have severe chest pain or pressure which may include symptoms such as:  You feel pain or pressure in your arms, neck, jaw, or back.  You have severe back or abdominal pain, feel sick to your stomach (nauseous), or throw up (vomit).  You are sweating profusely.  You are having a fast or irregular heartbeat.  You feel short of breath while at rest.  You notice increasing shortness of breath during rest, sleep, or with activity.  You have chest pain that does not get better after rest or after taking your usual medicine.  You wake from sleep with chest pain.  You are unable to sleep because you cannot breathe.  You develop a frequent cough or you are coughing up blood.  You feel dizzy, faint, or experience extreme fatigue.  You develop severe weakness, dizziness, fainting, or chills. Any of these symptoms may represent a serious problem that is an emergency. Do not wait to see if the symptoms will go away. Call your local emergency services (911 in the [place] Do not drive yourself to the hospital. MAKE SURE YOU:  Understand these instructions.  Will watch your condition.  Will get help right away if you are not doing well or get worse.   This information is not intended to replace advice given to you  by your health care provider. Make sure you discuss any questions you have with your health care provider.   Document Released: 12/14/2010 Document Revised: 11/16/2013 Document Reviewed: 05/13/2013 Elsevier Interactive Patient Education [employer].

## 2015-10-18 NOTE — ED Provider Notes (Signed)
CSN: [id number]     Arrival date & time 10/18/15  0909 History   First MD Initiated Contact with Patient 10/18/15 0915     Chief Complaint  Patient presents with  . Chest Pain     (Consider location/radiation/quality/duration/timing/severity/associated sxs/prior Treatment) HPI   44 year old male who was diagnosed with having left lower DVT diagnosed 3 weeks ago currently on Xarelto is presenting with complaint of chest pain. Patient report last night after eating a hamburger he developed gradual onset of left-sided chest pain. He described pain as a pressure sensation, nonradiating, persistent which lasted throughout the night and ongoing throughout the day today. No specific treatment tried. No aggravating or alleviating factor. There is no associated fever, chills, lightheadedness, dizziness, shortness of breath, productive cough, hemoptysis, back pain, abdominal pain, nausea vomiting or diarrhea. He denies having any rash. Given that he is currently having a DVT on treatment patient decided to come to ER for further evaluation. His DVT started shortly patient had a Achilles tendon rupture repair in October. Patient does have family history of cardiac disease, and he is an occasional smoker. He denies any strenuous activities. He has history of bradycardia from staying fit. Denies any recent recreational drug use. Patient has a remote cardiac stress test in 2007 which he reported was normal.  Past Medical History  Diagnosis Date  . Herpes genitalis in men   . Bilateral optic neuritis     on MRI  . Lyme disease 08/2007  . Tinea corporis   . Eczema   . Rhinitis   . Hyperlipidemia   . Substance abuse   . Alcoholism [facility](HCC)    Past Surgical History  Procedure Laterality Date  . Tonsillectomy    . Achilles tendon surgery Left 08/28/2015    Procedure: ACHILLES TENDON REPAIR;  Surgeon: Eldred MangesMark C Yates, MD;  [facility];  Service: Orthopedics;  Laterality: Left;   Family History  Problem  Relation Age of Onset  . Lung cancer Mother 7071    and brain cancer  . Diabetes Mother   . Heart disease Father 3764    arrhythmia  . Hypertension Father   . Rectal cancer Sister 3444  . Diabetes Sister   . Hypertension Brother   . Diabetes Brother    Social History  Substance Use Topics  . Smoking status: Former Smoker -- 14 years    Types: Cigarettes  . Smokeless tobacco: Never Used     Comment: quit 2002  . Alcohol Use: No     Comment: 08/25/15 - sorber 60 days    Review of Systems  All other systems reviewed and are negative.     Allergies  Review of patient's allergies indicates no known allergies.  Home Medications   Prior to Admission medications   Medication Sig Start Date End Date Taking? Authorizing Provider  cetirizine (ZYRTEC) 10 MG tablet Take 10 mg by mouth daily as needed for allergies.    Historical Provider, MD  Cholecalciferol (VITAMIN D PO) Take 1 capsule by mouth 2 (two) times daily.    Historical Provider, MD  Omega-3 Fatty Acids (FISH OIL) 1200 MG CAPS Take 1 capsule by mouth 2 (two) times daily.    Historical Provider, MD  oxyCODONE-acetaminophen (ROXICET) 5-325 MG tablet Take 1 tablet by mouth every 4 (four) hours as needed for severe pain. Patient not taking: Reported on 09/26/2015 08/28/15   Naida SleightJames M Owens, PA-C  XARELTO STARTER PACK 15 & 20 MG TBPK Take 15-20 mg by mouth  as directed. Take as directed on package: Start with one  tablet by mouth twice a day with food. On Day 22, switch to one  tablet once a day with food. Patient not taking: Reported on 09/26/2015 09/25/15   Mercedes Camprubi-Soms, PA-C   There were no vitals taken for this visit. Physical Exam  Constitutional: He appears well-developed and well-nourished. No distress.  Awake, alert, nontoxic appearance  HENT:  Head: Atraumatic.  Eyes: Conjunctivae are normal. Right eye exhibits no discharge. Left eye exhibits no discharge.  Neck: Normal range of motion. Neck supple.   Cardiovascular: Normal rate and regular rhythm.   Pulmonary/Chest: Effort normal. No respiratory distress. He exhibits no tenderness.  Abdominal: Soft. There is no tenderness. There is no rebound.  Musculoskeletal: He exhibits no tenderness.  Wearing Cam walker on left foot  Neurological: He is alert.  Skin: Skin is warm and dry. No rash noted.  Psychiatric: He has a normal mood and affect.  Nursing note and vitals reviewed.   ED Course  Procedures (including critical care time)  Patient with history of DVT currently on Xarelto here with chest pain. Workup initiated, will obtain chest CT angiogram to rule out PE. Care discussed with Dr. Freida Busman.  1:20 PM Patient report moderate improvement after receiving GI cocktail. He does admits that he has had recurrent similar pain after eating which likely suggestive of possible GERD. Does not have any significant abdominal pain concerning for gallbladder etiology at this time. Patient's labs are reassuring, chest CT angio without evidence of PE or any concerning finding. Patient stable for discharge with close follow-up with PCP for further care. Prilosec and Zantac prescribed. Return precautions discussed.  Labs Review Labs Reviewed  BASIC METABOLIC PANEL - Abnormal; Notable for the following:    Glucose, Bld 102 (*)    All other components within normal limits  CBC  I-STAT TROPOININ, ED  I-STAT CHEM 8, ED  I-STAT TROPOININ, ED    Imaging Review Dg Chest 2 View  10/18/2015  CLINICAL DATA:  Left mid chest pain starting yesterday, recurring today. Occasional smoker. EXAM: CHEST  2 VIEW COMPARISON:  Chest x-ray dated 08/28/2015. FINDINGS: Heart size is upper normal, stable. Overall cardiomediastinal silhouette is stable in size and configuration. Pulmonary vasculature remains within normal limits. Lungs remain clear. No pleural effusion. No pneumothorax. No osseous abnormality. IMPRESSION: Normal chest x-ray. Electronically Signed   By: Bary Richard M.D.   On: 10/18/2015 09:41   Ct Angio Chest Pe W/cm &/or Wo Cm  10/18/2015  CLINICAL DATA:  44 year old male with a history of chest pain and shortness of breath. EXAM: CT pulmonary artery ANGIOGRAPHY CHEST WITH CONTRAST TECHNIQUE: Multidetector CT imaging of the chest was performed using the standard protocol during bolus administration of intravenous contrast. Multiplanar CT image reconstructions and MIPs were obtained to evaluate the pulmonary artery vascular anatomy. CONTRAST:  OMNIPAQUE IOHEXOL 350 MG/ML SOLN COMPARISON:  None. FINDINGS: Chest: Unremarkable appearance of the chest superficial soft tissues. No axillary or supraclavicular adenopathy. Unremarkable appearance of the thoracic inlet, including the visualized thyroid. Several mediastinal lymph nodes, including right hilar lymph nodes, none of which are enlarged by CT size criteria or have suspicious features. Unremarkable appearance of the esophagus. Unremarkable course caliber and contour of the thoracic aorta without aneurysm, periaortic fluid. No central, lobar, segmental, or proximal subsegmental filling defects to indicate pulmonary emboli. Heart size within normal limits without pericardial fluid/ thickening. No confluent airspace disease. No pneumothorax or pleural effusion. Review  of the MIP images confirms the above findings. IMPRESSION: CT is negative for pulmonary emboli or other acute finding to account for the patient's symptoms. Signed, Jaime S. Loreta Ave, DO [facility] Radiology Electronically Signed   By: Gilmer Mor D.O.   On: 10/18/2015 11:52   I have personally reviewed and evaluated these images and lab results as part of my medical decision-making.   EKG Interpretation None     ED ECG REPORT   Date: 10/18/2015  Rate: 50  Rhythm: sinus bradycardia  QRS Axis: normal  Intervals: normal  ST/T Wave abnormalities: early repolarization  Conduction  Disutrbances:none  Narrative Interpretation:   Old EKG Reviewed: unchanged  I have personally reviewed the EKG tracing and agree with the computerized printout as noted.   MDM   Final diagnoses:  Atypical chest pain   BP 127/87 mmHg  Pulse 48  Temp(Src) 98.6 F (37 C) (Oral)  Resp 18  SpO2 100%     Fayrene Helper, PA-C 10/18/15 1536  Lorre Nick, MD 10/19/15 [phone]

## 2015-10-18 NOTE — ED Notes (Signed)
Patient c/o chest pain and SOB after eating last night, it eased up, and then returned this am. Patient wife at bedside, states patient has recently had similar chest pain after eating the past 2 days.

## 2015-10-30 ENCOUNTER — Ambulatory Visit: Payer: Self-pay | Admitting: Internal Medicine

## 2015-10-30 DIAGNOSIS — Z0289 Encounter for other administrative examinations: Secondary | ICD-10-CM

## 2015-11-06 ENCOUNTER — Ambulatory Visit: Payer: 59 | Admitting: Internal Medicine

## 2015-11-13 ENCOUNTER — Other Ambulatory Visit (INDEPENDENT_AMBULATORY_CARE_PROVIDER_SITE_OTHER): Payer: 59

## 2015-11-13 ENCOUNTER — Encounter: Payer: Self-pay | Admitting: Internal Medicine

## 2015-11-13 ENCOUNTER — Ambulatory Visit (INDEPENDENT_AMBULATORY_CARE_PROVIDER_SITE_OTHER): Payer: 59 | Admitting: Internal Medicine

## 2015-11-13 VITALS — BP 104/64 | HR 67 | Wt 216.0 lb

## 2015-11-13 DIAGNOSIS — I82432 Acute embolism and thrombosis of left popliteal vein: Secondary | ICD-10-CM

## 2015-11-13 DIAGNOSIS — I82409 Acute embolism and thrombosis of unspecified deep veins of unspecified lower extremity: Secondary | ICD-10-CM | POA: Insufficient documentation

## 2015-11-13 DIAGNOSIS — G43109 Migraine with aura, not intractable, without status migrainosus: Secondary | ICD-10-CM

## 2015-11-13 DIAGNOSIS — E291 Testicular hypofunction: Secondary | ICD-10-CM

## 2015-11-13 DIAGNOSIS — S86012S Strain of left Achilles tendon, sequela: Secondary | ICD-10-CM

## 2015-11-13 DIAGNOSIS — R7989 Other specified abnormal findings of blood chemistry: Secondary | ICD-10-CM

## 2015-11-13 DIAGNOSIS — Z0000 Encounter for general adult medical examination without abnormal findings: Secondary | ICD-10-CM

## 2015-11-13 DIAGNOSIS — S86019A Strain of unspecified Achilles tendon, initial encounter: Secondary | ICD-10-CM | POA: Insufficient documentation

## 2015-11-13 LAB — BASIC METABOLIC PANEL
BUN: 15 mg/dL (ref 6–23)
CHLORIDE: 102 meq/L (ref 96–112)
CO2: 30 mEq/L (ref 19–32)
Calcium: 10 mg/dL (ref 8.4–10.5)
Creatinine, Ser: 1.16 mg/dL (ref 0.40–1.50)
GFR: 87.66 mL/min (ref >60.00)
GLUCOSE: 104 mg/dL — AB (ref 70–99)
POTASSIUM: 4.9 meq/L (ref 3.5–5.1)
SODIUM: 138 meq/L (ref 135–145)

## 2015-11-13 LAB — CBC WITH DIFFERENTIAL/PLATELET
BASOS ABS: 0 10*3/uL (ref 0.0–0.1)
BASOS PCT: 0.4 % (ref 0.0–3.0)
EOS ABS: 0.1 10*3/uL (ref 0.0–0.7)
Eosinophils Relative: 1.2 % (ref 0.0–5.0)
HEMATOCRIT: 47 % (ref 39.0–52.0)
HEMOGLOBIN: 15.5 g/dL (ref 13.0–17.0)
LYMPHS PCT: 30.3 % (ref 12.0–46.0)
Lymphs Abs: 2.1 10*3/uL (ref 0.7–4.0)
MCHC: 33 g/dL (ref 30.0–36.0)
MCV: 89.3 fl (ref 78.0–100.0)
MONO ABS: 0.4 10*3/uL (ref 0.1–1.0)
Monocytes Relative: 6.4 % (ref 3.0–12.0)
Neutro Abs: 4.3 10*3/uL (ref 1.4–7.7)
Neutrophils Relative %: 61.7 % (ref 43.0–77.0)
PLATELETS: 182 10*3/uL (ref 150.0–400.0)
RBC: 5.26 Mil/uL (ref 4.22–5.81)
RDW: 14.6 % (ref 11.5–15.5)
WBC: 7 10*3/uL (ref 4.0–10.5)

## 2015-11-13 LAB — HEPATIC FUNCTION PANEL
ALK PHOS: 62 U/L (ref 39–117)
ALT: 23 U/L (ref 0–53)
AST: 18 U/L (ref 0–37)
Albumin: 4.4 g/dL (ref 3.5–5.2)
BILIRUBIN TOTAL: 0.5 mg/dL (ref 0.2–1.2)
Bilirubin, Direct: 0.1 mg/dL (ref 0.0–0.3)
Total Protein: 7.4 g/dL (ref 6.0–8.3)

## 2015-11-13 LAB — LIPID PANEL
CHOLESTEROL: 190 mg/dL (ref 0–200)
HDL: 53.8 mg/dL (ref >39.00)
LDL CALC: 125 mg/dL — AB (ref 0–99)
NonHDL: 135.93
TRIGLYCERIDES: 57 mg/dL (ref 0.0–149.0)
Total CHOL/HDL Ratio: 4
VLDL: 11.4 mg/dL (ref 0.0–40.0)

## 2015-11-13 LAB — URINALYSIS
Bilirubin Urine: NEGATIVE
Hgb urine dipstick: NEGATIVE
Ketones, ur: NEGATIVE
LEUKOCYTES UA: NEGATIVE
Nitrite: NEGATIVE
PH: 6 (ref 5.0–8.0)
SPECIFIC GRAVITY, URINE: 1.015 (ref 1.000–1.030)
Total Protein, Urine: NEGATIVE
UROBILINOGEN UA: 0.2 (ref 0.0–1.0)
Urine Glucose: NEGATIVE

## 2015-11-13 LAB — PSA: PSA: 0.46 ng/mL (ref 0.10–4.00)

## 2015-11-13 LAB — VITAMIN D 25 HYDROXY (VIT D DEFICIENCY, FRACTURES): VITD: 36.31 ng/mL (ref 30.00–100.00)

## 2015-11-13 LAB — TSH: TSH: 1.93 u[IU]/mL (ref 0.35–4.50)

## 2015-11-13 NOTE — Progress Notes (Signed)
Subjective:  Patient ID: Timothy Petersen, male    DOB: 12-Aug-1971  Age: 44 y.o. MRN: [id number]  CC: No chief complaint on file.   HPI Timothy Petersen presents for L Achilles tendon rupture/repair in 10/16 complicated by LLE DVT. Pt had a CP w/GERD - chest CT angio was (-).  Outpatient Prescriptions Prior to Visit  Medication Sig Dispense Refill  . cetirizine (ZYRTEC) 10 MG tablet Take 10 mg by mouth daily as needed for allergies.    . Cholecalciferol (VITAMIN D PO) Take 1 capsule by mouth 2 (two) times daily.    . Omega-3 Fatty Acids (FISH OIL) 1200 MG CAPS Take 1 capsule by mouth 2 (two) times daily.    Marland Kitchen. omeprazole (PRILOSEC) 20 MG capsule Take 1 capsule (20 mg total) by mouth 2 (two) times daily before a meal. 20 capsule 0  . oxyCODONE-acetaminophen (ROXICET) 5-325 MG tablet Take 1 tablet by mouth every 4 (four) hours as needed for severe pain. 60 tablet 0  . oxymetazoline (AFRIN) 0.05 % nasal spray Place 1-2 sprays into both nostrils 2 (two) times daily as needed for congestion.    . ranitidine (ZANTAC) 150 MG capsule Take 1 capsule (150 mg total) by mouth daily. 30 capsule 0  . XARELTO STARTER PACK 15 & 20 MG TBPK Take 15-20 mg by mouth as directed. Take as directed on package: Start with one 15mg  tablet by mouth twice a day with food. On Day 22, switch to one 20mg  tablet once a day with food. 51 each 0   No facility-administered medications prior to visit.    ROS Review of Systems  Constitutional: Negative for appetite change, fatigue and unexpected weight change.  HENT: Negative for congestion, nosebleeds, sneezing, sore throat and trouble swallowing.   Eyes: Negative for itching and visual disturbance.  Respiratory: Negative for cough.   Cardiovascular: Negative for chest pain, palpitations and leg swelling.  Gastrointestinal: Negative for nausea, diarrhea, blood in stool and abdominal distention.  Genitourinary: Negative for frequency and hematuria.  Musculoskeletal:  Positive for gait problem. Negative for back pain, joint swelling and neck pain.  Skin: Negative for rash.  Neurological: Positive for headaches. Negative for dizziness, tremors, speech difficulty and weakness.  Psychiatric/Behavioral: Negative for suicidal ideas, sleep disturbance, dysphoric mood and agitation. The patient is not nervous/anxious.     Objective:  BP 104/64 mmHg  Pulse 67  Wt 216 lb (97.977 kg)  SpO2 97%  BP Readings from Last 3 Encounters:  11/13/15 104/64  10/18/15 127/87  09/26/15 108/2    Wt Readings from Last 3 Encounters:  11/13/15 216 lb (97.977 kg)  09/25/15 205 lb (92.987 kg)  08/28/15 205 lb (92.987 kg)    Physical Exam  Constitutional: He is oriented to person, place, and time. He appears well-developed. No distress.  NAD  HENT:  Mouth/Throat: Oropharynx is clear and moist.  Eyes: Conjunctivae are normal. Pupils are equal, round, and reactive to light.  Neck: Normal range of motion. No JVD present. No thyromegaly present.  Cardiovascular: Normal rate, regular rhythm, normal heart sounds and intact distal pulses.  Exam reveals no gallop and no friction rub.   No murmur heard. Pulmonary/Chest: Effort normal and breath sounds normal. No respiratory distress. He has no wheezes. He has no rales. He exhibits no tenderness.  Abdominal: Soft. Bowel sounds are normal. He exhibits no distension and no mass. There is no tenderness. There is no rebound and no guarding.  Musculoskeletal: Normal range of motion. He exhibits edema  and tenderness.  Lymphadenopathy:    He has no cervical adenopathy.  Neurological: He is alert and oriented to person, place, and time. He has normal reflexes. No cranial nerve deficit. He exhibits normal muscle tone. He displays a negative Romberg sign. Coordination and gait normal.  Skin: Skin is warm and dry. No rash noted. No erythema.  Psychiatric: He has a normal mood and affect. His behavior is normal. Judgment and thought content  normal.  LLE w/a surg boot on  Lab Results  Component Value Date   WBC 7.0 11/13/2015   HGB 15.5 11/13/2015   HCT 47.0 11/13/2015   PLT 182.0 11/13/2015   GLUCOSE 104* 11/13/2015   CHOL 190 11/13/2015   TRIG 57.0 11/13/2015   HDL 53.80 11/13/2015   LDLCALC 125* 11/13/2015   ALT 23 11/13/2015   AST 18 11/13/2015   NA 138 11/13/2015   K 4.9 11/13/2015   CL 102 11/13/2015   CREATININE 1.16 11/13/2015   BUN 15 11/13/2015   CO2 30 11/13/2015   TSH 1.93 11/13/2015   PSA 0.46 11/13/2015    Dg Chest 2 View  10/18/2015  CLINICAL DATA:  Left mid chest pain starting yesterday, recurring today. Occasional smoker. EXAM: CHEST  2 VIEW COMPARISON:  Chest x-ray dated 08/28/2015. FINDINGS: Heart size is upper normal, stable. Overall cardiomediastinal silhouette is stable in size and configuration. Pulmonary vasculature remains within normal limits. Lungs remain clear. No pleural effusion. No pneumothorax. No osseous abnormality. IMPRESSION: Normal chest x-ray. Electronically Signed   By: Bary Richard M.D.   On: 10/18/2015 09:41   Ct Angio Chest Pe W/cm &/or Wo Cm  10/18/2015  CLINICAL DATA:  44 year old male with a history of chest pain and shortness of breath. EXAM: CT pulmonary artery ANGIOGRAPHY CHEST WITH CONTRAST TECHNIQUE: Multidetector CT imaging of the chest was performed using the standard protocol during bolus administration of intravenous contrast. Multiplanar CT image reconstructions and MIPs were obtained to evaluate the pulmonary artery vascular anatomy. CONTRAST:  OMNIPAQUE IOHEXOL 350 MG/ML SOLN COMPARISON:  None. FINDINGS: Chest: Unremarkable appearance of the chest superficial soft tissues. No axillary or supraclavicular adenopathy. Unremarkable appearance of the thoracic inlet, including the visualized thyroid. Several mediastinal lymph nodes, including right hilar lymph nodes, none of which are enlarged by CT size criteria or have suspicious features. Unremarkable appearance  of the esophagus. Unremarkable course caliber and contour of the thoracic aorta without aneurysm, periaortic fluid. No central, lobar, segmental, or proximal subsegmental filling defects to indicate pulmonary emboli. Heart size within normal limits without pericardial fluid/ thickening. No confluent airspace disease. No pneumothorax or pleural effusion. Review of the MIP images confirms the above findings. IMPRESSION: CT is negative for pulmonary emboli or other acute finding to account for the patient's symptoms. Signed, Yvone Neu. Loreta Ave, DO [facility] Radiology Electronically Signed   By: Gilmer Mor D.O.   On: 10/18/2015 11:52    Assessment & Plan:   Diagnoses and all orders for this visit:  Ruptured Achilles tendon due to trauma, left, sequela -     Testosterone, Free, Total, SHBG; Future -     CBC with Differential/Platelet; Future -     Basic metabolic panel; Future -     Hepatic function panel; Future -     TSH; Future -     PSA; Future -     Urinalysis; Future -     VITAMIN D 25 Hydroxy (Vit-D Deficiency, Fractures); Future -  Lipid panel; Future  Migraine with aura and without status migrainosus, not intractable -     Testosterone, Free, Total, SHBG; Future -     CBC with Differential/Platelet; Future -     Basic metabolic panel; Future -     Hepatic function panel; Future -     TSH; Future -     PSA; Future -     Urinalysis; Future -     VITAMIN D 25 Hydroxy (Vit-D Deficiency, Fractures); Future -     Lipid panel; Future  Acute deep vein thrombosis (DVT) of popliteal vein of left lower extremity (HCC) -     Testosterone, Free, Total, SHBG; Future -     CBC with Differential/Platelet; Future -     Basic metabolic panel; Future -     Hepatic function panel; Future -     TSH; Future -     PSA; Future -     Urinalysis; Future -     VITAMIN D 25 Hydroxy (Vit-D Deficiency, Fractures); Future -     Lipid panel;  Future  Low testosterone -     Testosterone, Free, Total, SHBG; Future -     CBC with Differential/Platelet; Future -     Basic metabolic panel; Future -     Hepatic function panel; Future -     TSH; Future -     PSA; Future -     Urinalysis; Future -     VITAMIN D 25 Hydroxy (Vit-D Deficiency, Fractures); Future -     Lipid panel; Future  Well adult exam -     Testosterone, Free, Total, SHBG; Future -     CBC with Differential/Platelet; Future -     Basic metabolic panel; Future -     Hepatic function panel; Future -     TSH; Future -     PSA; Future -     Urinalysis; Future -     VITAMIN D 25 Hydroxy (Vit-D Deficiency, Fractures); Future -     Lipid panel; Future  I am having Mr. Faulkenberry maintain his Fish Oil, Cholecalciferol (VITAMIN D PO), cetirizine, oxyCODONE-acetaminophen, XARELTO STARTER PACK, oxymetazoline, omeprazole, and ranitidine.  No orders of the defined types were placed in this encounter.     Follow-up: Return for a follow-up visit.  Sonda Primes, MD

## 2015-11-13 NOTE — Progress Notes (Signed)
Pre visit review using our clinic review tool, if applicable. No additional management support is needed unless otherwise documented below in the visit note. 

## 2015-11-13 NOTE — Assessment & Plan Note (Signed)
Aleve prn 

## 2015-11-13 NOTE — Assessment & Plan Note (Signed)
Labs

## 2015-11-13 NOTE — Assessment & Plan Note (Signed)
10/16 LLE LLE DVT due to achilles tendon rupture/repair

## 2015-11-14 LAB — TESTOSTERONE, FREE, TOTAL, SHBG
SEX HORMONE BINDING: 30 nmol/L (ref 10–50)
Testosterone, Free: 75.6 pg/mL (ref 47.0–244.0)
Testosterone-% Free: 2.1 % (ref 1.6–2.9)
Testosterone: 357 ng/dL (ref 300–890)

## 2017-03-21 IMAGING — CT CT ANGIO CHEST
2 of 6 series · 19 of 36 positions shown · IV contrast (omnipaque)
Comparison: None.

CLINICAL DATA: 44-year-old male with a history of chest pain and
shortness of breath.

EXAM:
CT pulmonary artery ANGIOGRAPHY CHEST WITH CONTRAST
TECHNIQUE: Multidetector CT imaging of the chest was performed using the
standard protocol during bolus administration of intravenous
contrast. Multiplanar CT image reconstructions and MIPs were
obtained to evaluate the pulmonary artery vascular anatomy.
CONTRAST:  100mL OMNIPAQUE IOHEXOL 350 MG/ML SOLN

[Series 6: thins for pacs · axial · 0.74mm/px · z∈[+1364,+1620]mm · 18 of 287 slices shown]
[im 15/287  lung]
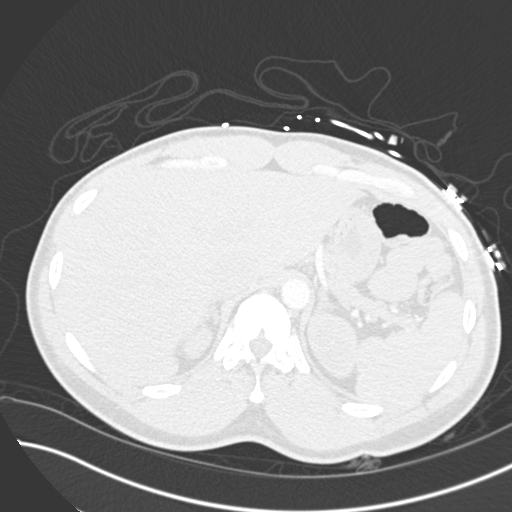
[im 29/287  mediastinal]
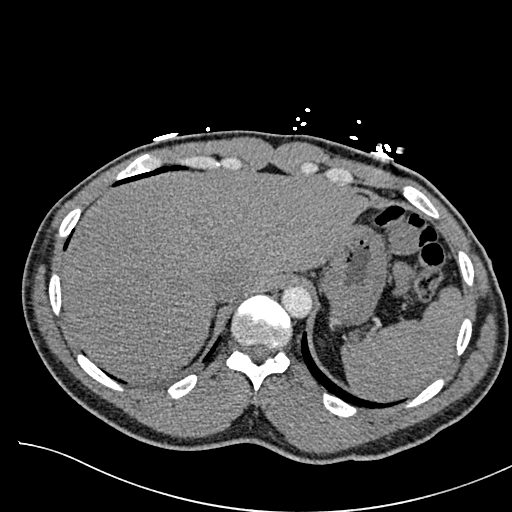
[im 43/287  lung]
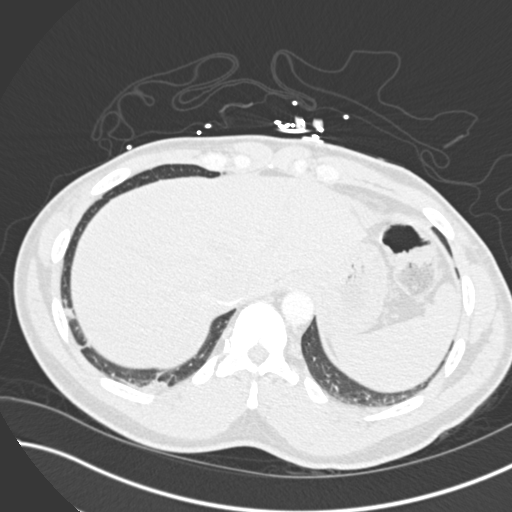
[im 58/287  mediastinal]
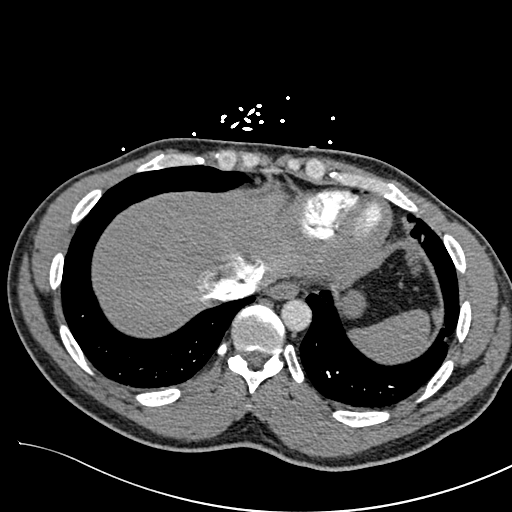
[im 72/287  lung]
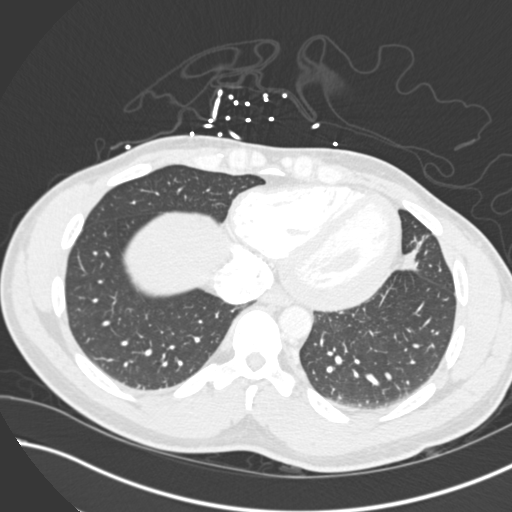
[im 86/287  mediastinal]
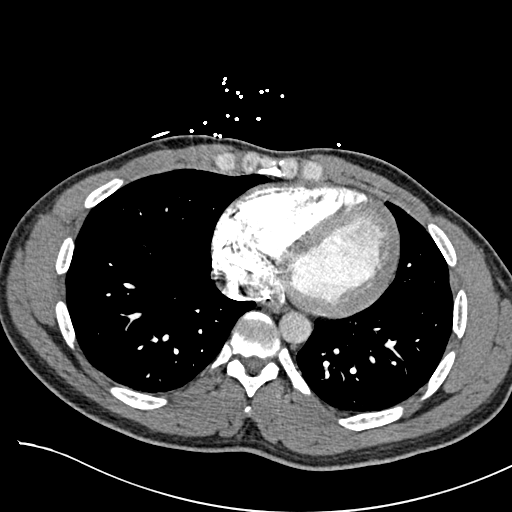
[im 101/287  lung]
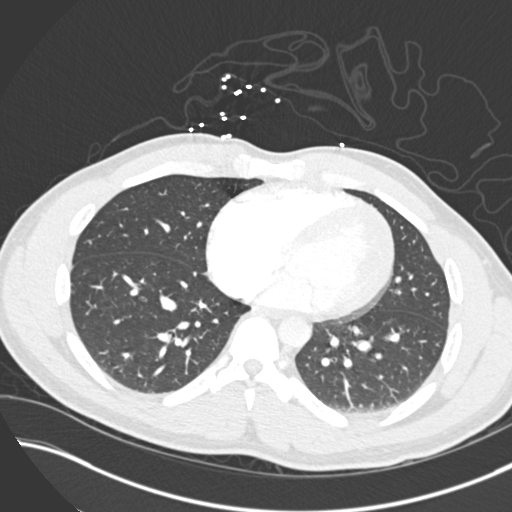
[im 115/287  mediastinal]
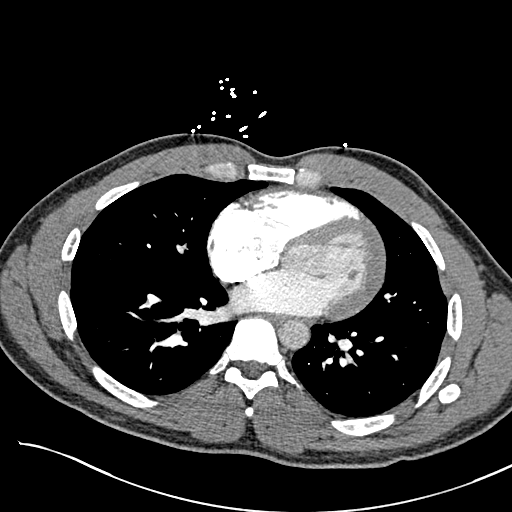
[im 129/287  lung]
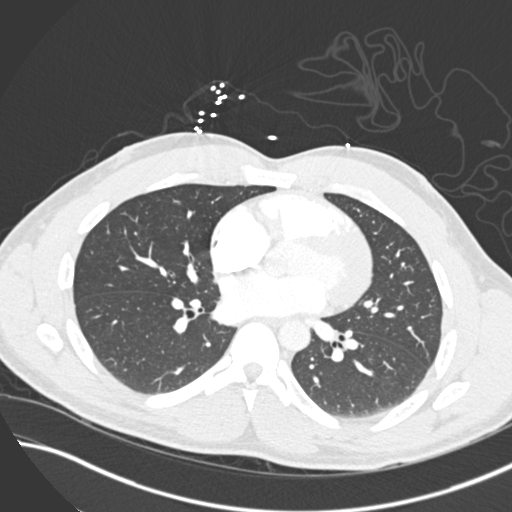
[im 158/287  mediastinal]
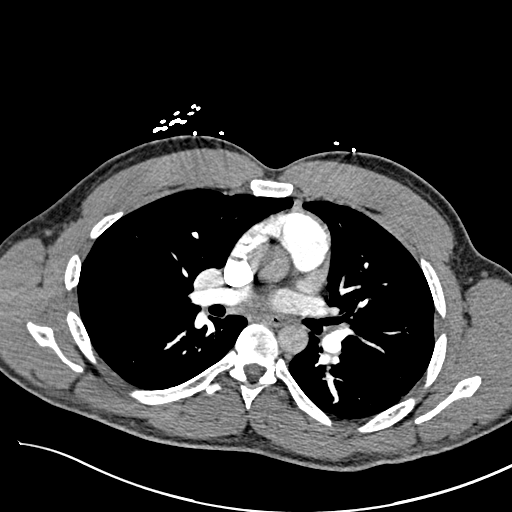
[im 172/287  lung]
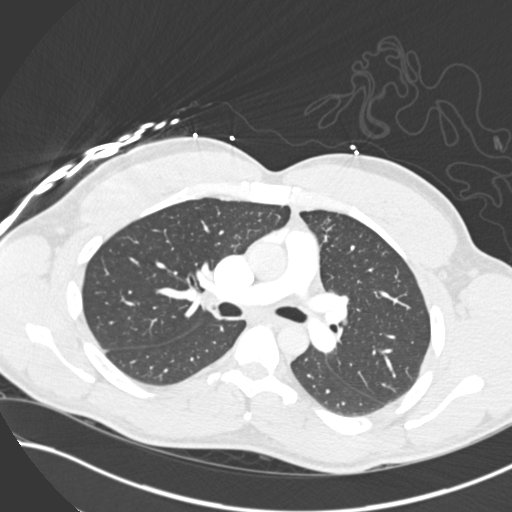
[im 186/287  mediastinal]
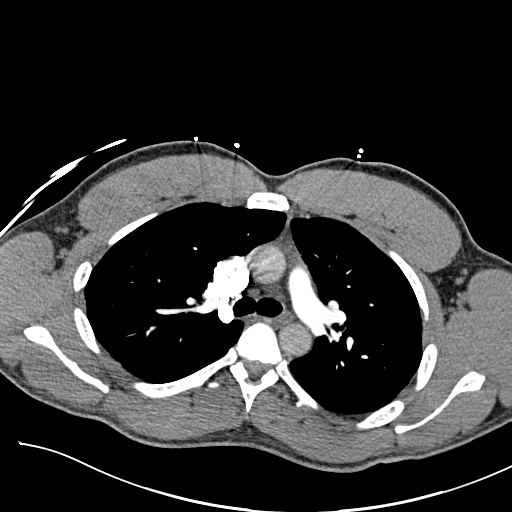
[im 201/287  lung]
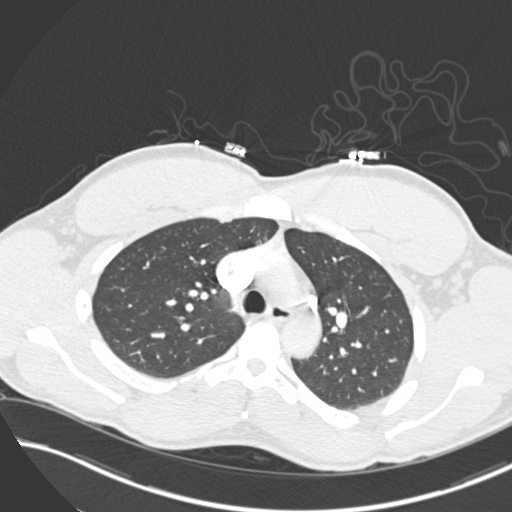
[im 215/287  mediastinal]
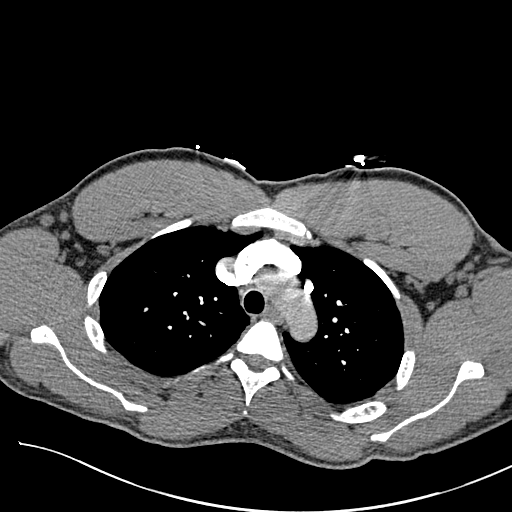
[im 229/287  lung]
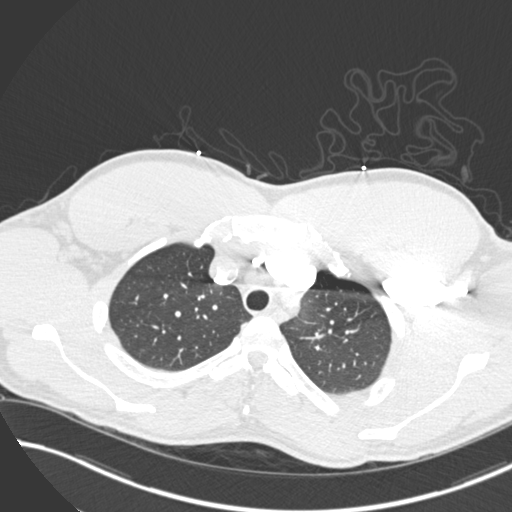
[im 244/287  mediastinal]
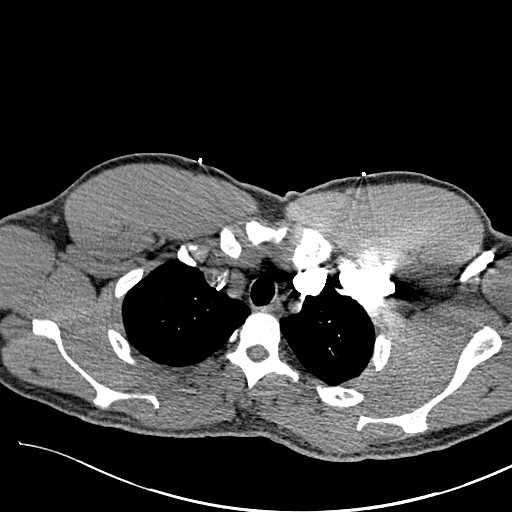
[im 258/287  lung]
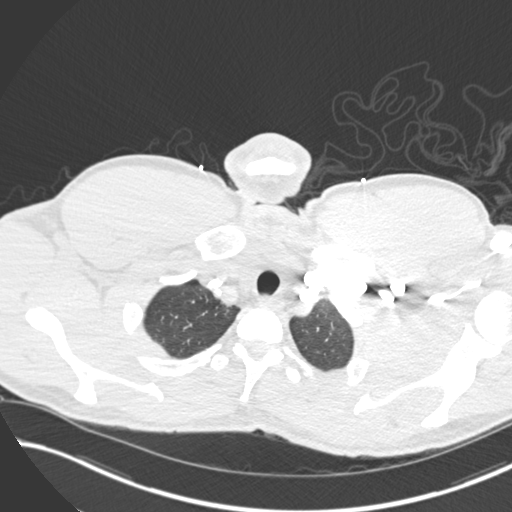
[im 272/287  mediastinal]
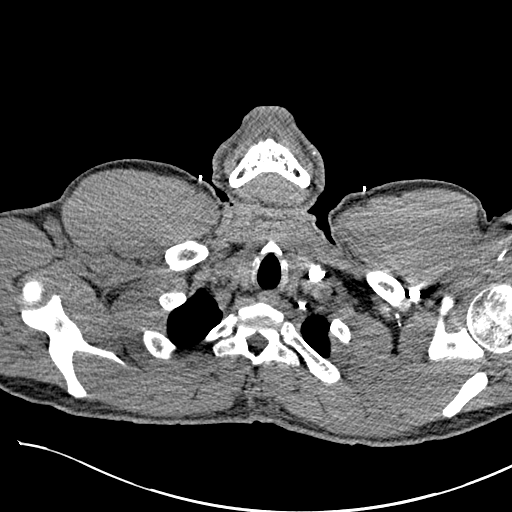

[Series 8: coronal mpr · coronal · 0.55mm/px · 1 of 151 slices shown]
[im 76/151  mediastinal]
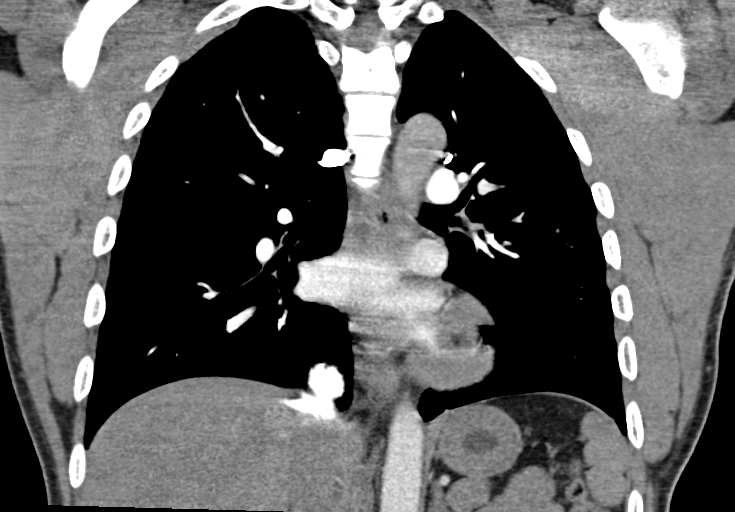

[19 of 36 positions shown; findings below may reference images not displayed]

FINDINGS: Chest:

Unremarkable appearance of the chest superficial soft tissues.

No axillary or supraclavicular adenopathy.

Unremarkable appearance of the thoracic inlet, including the
visualized thyroid.

Several mediastinal lymph nodes, including right hilar lymph nodes,
none of which are enlarged by CT size criteria or have suspicious
features.

Unremarkable appearance of the esophagus.

Unremarkable course caliber and contour of the thoracic aorta
without aneurysm, periaortic fluid.

No central, lobar, segmental, or proximal subsegmental filling
defects to indicate pulmonary emboli.

Heart size within normal limits without pericardial fluid/
thickening.

No confluent airspace disease.

No pneumothorax or pleural effusion.

Review of the MIP images confirms the above findings.
IMPRESSION: CT is negative for pulmonary emboli or other acute finding to
account for the patient's symptoms.

## 2017-08-12 ENCOUNTER — Encounter: Payer: Self-pay | Admitting: Internal Medicine
